# Patient Record
Sex: Female | Born: 1935 | Race: White | Hispanic: No | Smoking: Never smoker
Health system: Southern US, Community
[De-identification: ages and names within clinical notes are randomized; demographics above are authoritative.]

## PROBLEM LIST (undated history)

## (undated) DIAGNOSIS — M069 Rheumatoid arthritis, unspecified: Secondary | ICD-10-CM

## (undated) DIAGNOSIS — F0390 Unspecified dementia without behavioral disturbance: Secondary | ICD-10-CM

## (undated) DIAGNOSIS — I2699 Other pulmonary embolism without acute cor pulmonale: Secondary | ICD-10-CM

## (undated) DIAGNOSIS — I480 Paroxysmal atrial fibrillation: Secondary | ICD-10-CM

## (undated) DIAGNOSIS — R001 Bradycardia, unspecified: Secondary | ICD-10-CM

## (undated) DIAGNOSIS — R55 Syncope and collapse: Secondary | ICD-10-CM

## (undated) DIAGNOSIS — E785 Hyperlipidemia, unspecified: Secondary | ICD-10-CM

## (undated) DIAGNOSIS — I4891 Unspecified atrial fibrillation: Secondary | ICD-10-CM

## (undated) DIAGNOSIS — U071 COVID-19: Secondary | ICD-10-CM

## (undated) DIAGNOSIS — I10 Essential (primary) hypertension: Secondary | ICD-10-CM

## (undated) DIAGNOSIS — K2270 Barrett's esophagus without dysplasia: Secondary | ICD-10-CM

## (undated) DIAGNOSIS — J9690 Respiratory failure, unspecified, unspecified whether with hypoxia or hypercapnia: Secondary | ICD-10-CM

## (undated) HISTORY — DX: Essential (primary) hypertension: I10

## (undated) HISTORY — DX: Rheumatoid arthritis, unspecified: M06.9

## (undated) HISTORY — DX: Syncope and collapse: R55

## (undated) HISTORY — DX: Hyperlipidemia, unspecified: E78.5

## (undated) HISTORY — DX: Bradycardia, unspecified: R00.1

---
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
## 2009-04-29 ENCOUNTER — Emergency Department (HOSPITAL_BASED_OUTPATIENT_CLINIC_OR_DEPARTMENT_OTHER): Admission: EM | Admit: 2009-04-29 | Discharge: 2009-04-29 | Payer: Self-pay | Admitting: Emergency Medicine

## 2009-04-29 ENCOUNTER — Ambulatory Visit: Payer: Self-pay | Admitting: Diagnostic Radiology

## 2010-12-30 LAB — POCT CARDIAC MARKERS
CKMB, poc: 2.6 ng/mL (ref 1.0–8.0)
Myoglobin, poc: 62.6 ng/mL (ref 12–200)
Troponin i, poc: <0.05 ng/mL (ref 0.00–0.09)

## 2010-12-30 LAB — DIFFERENTIAL
Basophils Absolute: 0.1 10*3/uL (ref 0.0–0.1)
Basophils Relative: 2 % — ABNORMAL HIGH (ref 0–1)
Eosinophils Absolute: 0 10*3/uL (ref 0.0–0.7)
Eosinophils Relative: 1 % (ref 0–5)
Lymphocytes Relative: 18 % (ref 12–46)
Lymphs Abs: 1.4 10*3/uL (ref 0.7–4.0)
Monocytes Absolute: 0.5 10*3/uL (ref 0.1–1.0)
Monocytes Relative: 6 % (ref 3–12)
Neutro Abs: 5.7 10*3/uL (ref 1.7–7.7)
Neutrophils Relative %: 74 % (ref 43–77)

## 2010-12-30 LAB — URINE MICROSCOPIC-ADD ON

## 2010-12-30 LAB — CBC
HCT: 46.6 % — ABNORMAL HIGH (ref 36.0–46.0)
Hemoglobin: 15.5 g/dL — ABNORMAL HIGH (ref 12.0–15.0)
MCHC: 33.3 g/dL (ref 30.0–36.0)
MCV: 94 fL (ref 78.0–100.0)
Platelets: 285 10*3/uL (ref 150–400)
RBC: 4.96 MIL/uL (ref 3.87–5.11)
RDW: 12.2 % (ref 11.5–15.5)
WBC: 7.7 10*3/uL (ref 4.0–10.5)

## 2010-12-30 LAB — BASIC METABOLIC PANEL
BUN: 15 mg/dL (ref 6–23)
CO2: 26 mEq/L (ref 19–32)
Calcium: 10 mg/dL (ref 8.4–10.5)
Chloride: 93 mEq/L — ABNORMAL LOW (ref 96–112)
Creatinine, Ser: 0.8 mg/dL (ref 0.4–1.2)
GFR calc Af Amer: >60 mL/min (ref >60)
GFR calc non Af Amer: >60 mL/min (ref >60)
Glucose, Bld: 124 mg/dL — ABNORMAL HIGH (ref 70–99)
Potassium: 4.2 mEq/L (ref 3.5–5.1)
Sodium: 132 mEq/L — ABNORMAL LOW (ref 135–145)

## 2010-12-30 LAB — POCT B-TYPE NATRIURETIC PEPTIDE (BNP): B Natriuretic Peptide, POC: 104 pg/mL — ABNORMAL HIGH (ref 0–100)

## 2010-12-30 LAB — URINALYSIS, ROUTINE W REFLEX MICROSCOPIC
Bilirubin Urine: NEGATIVE
Glucose, UA: NEGATIVE mg/dL
Hgb urine dipstick: NEGATIVE
Ketones, ur: 15 mg/dL — AB
Nitrite: NEGATIVE
Protein, ur: NEGATIVE mg/dL
Specific Gravity, Urine: 1.008 (ref 1.005–1.030)
Urobilinogen, UA: 0.2 mg/dL (ref 0.0–1.0)
pH: 8 (ref 5.0–8.0)

## 2019-09-29 ENCOUNTER — Emergency Department (HOSPITAL_COMMUNITY)
Admission: EM | Admit: 2019-09-29 | Discharge: 2019-09-29 | Disposition: A | Discharge status: Home / Self Care | Payer: Medicare Other | Attending: Emergency Medicine | Admitting: Emergency Medicine

## 2019-09-29 ENCOUNTER — Emergency Department (HOSPITAL_COMMUNITY): Payer: Medicare Other

## 2019-09-29 ENCOUNTER — Encounter (HOSPITAL_COMMUNITY): Payer: Self-pay | Admitting: Emergency Medicine

## 2019-09-29 ENCOUNTER — Other Ambulatory Visit: Payer: Self-pay

## 2019-09-29 DIAGNOSIS — F0390 Unspecified dementia without behavioral disturbance: Secondary | ICD-10-CM | POA: Diagnosis not present

## 2019-09-29 DIAGNOSIS — W19XXXA Unspecified fall, initial encounter: Secondary | ICD-10-CM

## 2019-09-29 DIAGNOSIS — R55 Syncope and collapse: Secondary | ICD-10-CM | POA: Insufficient documentation

## 2019-09-29 DIAGNOSIS — Z043 Encounter for examination and observation following other accident: Secondary | ICD-10-CM | POA: Insufficient documentation

## 2019-09-29 LAB — COMPREHENSIVE METABOLIC PANEL
ALT: 14 U/L (ref 0–44)
AST: 16 U/L (ref 15–41)
Albumin: 3.2 g/dL — ABNORMAL LOW (ref 3.5–5.0)
Alkaline Phosphatase: 61 U/L (ref 38–126)
Anion gap: 9 (ref 5–15)
BUN: 27 mg/dL — ABNORMAL HIGH (ref 8–23)
CO2: 25 mmol/L (ref 22–32)
Calcium: 9 mg/dL (ref 8.9–10.3)
Chloride: 109 mmol/L (ref 98–111)
Creatinine, Ser: 0.98 mg/dL (ref 0.44–1.00)
GFR calc Af Amer: 59 mL/min — ABNORMAL LOW (ref >60)
GFR calc non Af Amer: 51 mL/min — ABNORMAL LOW (ref >60)
Glucose, Bld: 129 mg/dL — ABNORMAL HIGH (ref 70–99)
Potassium: 4.6 mmol/L (ref 3.5–5.1)
Sodium: 143 mmol/L (ref 135–145)
Total Bilirubin: 0.5 mg/dL (ref 0.3–1.2)
Total Protein: 6.3 g/dL — ABNORMAL LOW (ref 6.5–8.1)

## 2019-09-29 LAB — CBC WITH DIFFERENTIAL/PLATELET
Abs Immature Granulocytes: 0.05 10*3/uL (ref 0.00–0.07)
Basophils Absolute: 0.1 10*3/uL (ref 0.0–0.1)
Basophils Relative: 1 %
Eosinophils Absolute: 0.3 10*3/uL (ref 0.0–0.5)
Eosinophils Relative: 4 %
HCT: 35.4 % — ABNORMAL LOW (ref 36.0–46.0)
Hemoglobin: 10.9 g/dL — ABNORMAL LOW (ref 12.0–15.0)
Immature Granulocytes: 1 %
Lymphocytes Relative: 15 %
Lymphs Abs: 1 10*3/uL (ref 0.7–4.0)
MCH: 30.9 pg (ref 26.0–34.0)
MCHC: 30.8 g/dL (ref 30.0–36.0)
MCV: 100.3 fL — ABNORMAL HIGH (ref 80.0–100.0)
Monocytes Absolute: 0.7 10*3/uL (ref 0.1–1.0)
Monocytes Relative: 10 %
Neutro Abs: 4.7 10*3/uL (ref 1.7–7.7)
Neutrophils Relative %: 69 %
Platelets: 219 10*3/uL (ref 150–400)
RBC: 3.53 MIL/uL — ABNORMAL LOW (ref 3.87–5.11)
RDW: 16.6 % — ABNORMAL HIGH (ref 11.5–15.5)
WBC: 6.7 10*3/uL (ref 4.0–10.5)
nRBC: 0 % (ref 0.0–0.2)

## 2019-09-29 LAB — TROPONIN I (HIGH SENSITIVITY): Troponin I (High Sensitivity): 9 ng/L (ref <18)

## 2019-09-29 NOTE — ED Notes (Signed)
Patient transported to CT 

## 2019-09-29 NOTE — ED Provider Notes (Signed)
[facility]-EMERGENCY DEPT Provider Note   CSN: [id number] Arrival date & time: 09/29/19  1044     History Chief Complaint  Patient presents with  . Fall  . behavior issues    Sylvia Davila is a 86 y.o. female.  86 year old female from nursing facility here after patient had a witnessed fall.  Initially possible head injury.  Apparently according to the patient's nurse spoke with the facility has been some issues with behavioral problems.  Patient reportedly is unhappy with the facility.  Patient herself denies any chest pain or shortness of breath prior to the event.  Has had no recent illnesses.  No recent medication changes.  Denies any head or neck discomfort.  Patient placed in c-collar and transported here.  Does have a history of dementia.        History reviewed. No pertinent past medical history.  There are no problems to display for this patient.   History reviewed. No pertinent surgical history.   OB History   No obstetric history on file.     No family history on file.  Social History   Tobacco Use  . Smoking status: Not on file  Substance Use Topics  . Alcohol use: Not on file  . Drug use: Not on file    Home Medications Prior to Admission medications   Not on File    Allergies    Patient has no allergy information on record.  Review of Systems   Review of Systems  All other systems reviewed and are negative.   Physical Exam Updated Vital Signs BP 120/63 (BP Location: Left Arm)   Pulse (!) 52   Temp 97.6 F (36.4 C) (Oral)   Resp 15   SpO2 96%   Physical Exam Vitals and nursing note reviewed.  Constitutional:      General: She is not in acute distress.    Appearance: Normal appearance. She is well-developed. She is not toxic-appearing.  HENT:     Head: Normocephalic and atraumatic.  Eyes:     General: Lids are normal.     Conjunctiva/sclera: Conjunctivae normal.     Pupils: Pupils are equal, round, and  reactive to light.  Neck:     Thyroid: No thyroid mass.     Trachea: No tracheal deviation.  Cardiovascular:     Rate and Rhythm: Normal rate and regular rhythm.     Heart sounds: Normal heart sounds. No murmur. No gallop.   Pulmonary:     Effort: Pulmonary effort is normal. No respiratory distress.     Breath sounds: Normal breath sounds. No stridor. No decreased breath sounds, wheezing, rhonchi or rales.  Abdominal:     General: Bowel sounds are normal. There is no distension.     Palpations: Abdomen is soft.     Tenderness: There is no abdominal tenderness. There is no rebound.  Musculoskeletal:        General: No tenderness. Normal range of motion.     Cervical back: Normal range of motion and neck supple.  Skin:    General: Skin is warm and dry.     Findings: No abrasion or rash.  Neurological:     Mental Status: She is alert and oriented to person, place, and time. Mental status is at baseline.     GCS: GCS eye subscore is 4. GCS verbal subscore is 5. GCS motor subscore is 6.     Cranial Nerves: No cranial nerve deficit.  Sensory: No sensory deficit.     Motor: No weakness or tremor.  Psychiatric:        Attention and Perception: Attention normal.        Speech: Speech normal.        Behavior: Behavior normal.     ED Results / Procedures / Treatments   Labs (all labs ordered are listed, but only abnormal results are displayed) Labs Reviewed  CBC WITH DIFFERENTIAL/PLATELET  COMPREHENSIVE METABOLIC PANEL  TROPONIN I (HIGH SENSITIVITY)    EKG EKG Interpretation  Date/Time:  Wednesday September 29 2019 11:24:01 EST Ventricular Rate:  49 PR Interval:    QRS Duration: 94 QT Interval:  462 QTC Calculation: 418 R Axis:   26 Text Interpretation: Sinus bradycardia Confirmed by Lacretia Leigh ([id number]) on 09/29/2019 12:26:42 PM   Radiology No results found.  Procedures Procedures (including critical care time)  Medications Ordered in ED Medications - No data to  display  ED Course  I have reviewed the triage vital signs and the nursing notes.  Pertinent labs & imaging results that were available during my care of the patient were reviewed by me and considered in my medical decision making (see chart for details).    MDM Rules/Calculators/A&P                      Patient orthostatic negative here.  Head and neck CT without acute findings.  EKG is reassuring.  Feels fine at this time is requesting to go home. Final Clinical Impression(s) / ED Diagnoses Final diagnoses:  None    Rx / DC Orders ED Discharge Orders    None       Lacretia Leigh, MD 09/29/19 1347

## 2019-09-29 NOTE — ED Notes (Signed)
Report called to Toniann Fail at [facility].  PTAR transported pt back to facility.

## 2019-09-29 NOTE — ED Notes (Signed)
Pt is becoming agitated and requesting to leave. Pt removed tele monitoring, pulse ox, and bp cuff. NT explained delay and gave pt ham sandwich and water per request. No other concerns expressed at this time.

## 2019-09-29 NOTE — ED Notes (Signed)
Spoke with staff member, Toniann Fail, from [facility]- Per staff- Pt is "playing possum." A med tech was in the pts room this morning when med tech witnessed pt slide from her wheelchair to all 4's on the floor, then lay on her back on the floor.  Pt refused to be assisted off the floor.  While sitting on the floor resting again staff members leg, pt "pretended to faint again, but she was still holding her head up, she just was refusing to answer our questions, wouldn't open her eyes."  [facility] staff reports pt was seen by doctor yesterday, no new orders for pt.  Pt has not started any new medications. Pt has only been residing in facility for appx 2 weeks, no behavioral issues prior to todays event. EDP Freida Busman made aware.

## 2019-09-29 NOTE — ED Triage Notes (Signed)
Arrives via EMS from [facility], staff reports a loss of consciousness this AM, when staff tried to pick her up she reported to have another one, patient reports that she was acting during the second episode and that she does not like the facility. Hx of dementia. In C-collar. Vitals BP 136/74, P 56, O2 98% RA, CBG 168, T 97.9

## 2019-11-22 ENCOUNTER — Other Ambulatory Visit: Payer: Self-pay

## 2019-11-22 ENCOUNTER — Emergency Department (HOSPITAL_COMMUNITY)
Admission: EM | Admit: 2019-11-22 | Discharge: 2019-11-22 | Disposition: A | Discharge status: Home / Self Care | Payer: Medicare Other | Attending: Emergency Medicine | Admitting: Emergency Medicine

## 2019-11-22 ENCOUNTER — Emergency Department (HOSPITAL_COMMUNITY): Payer: Medicare Other

## 2019-11-22 DIAGNOSIS — N3000 Acute cystitis without hematuria: Secondary | ICD-10-CM

## 2019-11-22 DIAGNOSIS — R4182 Altered mental status, unspecified: Secondary | ICD-10-CM | POA: Diagnosis present

## 2019-11-22 LAB — URINALYSIS, ROUTINE W REFLEX MICROSCOPIC
Bilirubin Urine: NEGATIVE
Glucose, UA: NEGATIVE mg/dL
Hgb urine dipstick: NEGATIVE
Ketones, ur: NEGATIVE mg/dL
Nitrite: NEGATIVE
Protein, ur: 100 mg/dL — AB
Specific Gravity, Urine: 1.012 (ref 1.005–1.030)
WBC, UA: >50 WBC/hpf — ABNORMAL HIGH (ref 0–5)
pH: 9 — ABNORMAL HIGH (ref 5.0–8.0)

## 2019-11-22 LAB — CBC WITH DIFFERENTIAL/PLATELET
Abs Immature Granulocytes: 0.03 10*3/uL (ref 0.00–0.07)
Basophils Absolute: 0.1 10*3/uL (ref 0.0–0.1)
Basophils Relative: 1 %
Eosinophils Absolute: 0.1 10*3/uL (ref 0.0–0.5)
Eosinophils Relative: 1 %
HCT: 41.1 % (ref 36.0–46.0)
Hemoglobin: 12.9 g/dL (ref 12.0–15.0)
Immature Granulocytes: 1 %
Lymphocytes Relative: 20 %
Lymphs Abs: 1.3 10*3/uL (ref 0.7–4.0)
MCH: 30.4 pg (ref 26.0–34.0)
MCHC: 31.4 g/dL (ref 30.0–36.0)
MCV: 96.9 fL (ref 80.0–100.0)
Monocytes Absolute: 0.6 10*3/uL (ref 0.1–1.0)
Monocytes Relative: 9 %
Neutro Abs: 4.5 10*3/uL (ref 1.7–7.7)
Neutrophils Relative %: 68 %
Platelets: 217 10*3/uL (ref 150–400)
RBC: 4.24 MIL/uL (ref 3.87–5.11)
RDW: 14.8 % (ref 11.5–15.5)
WBC: 6.5 10*3/uL (ref 4.0–10.5)
nRBC: 0 % (ref 0.0–0.2)

## 2019-11-22 LAB — COMPREHENSIVE METABOLIC PANEL
ALT: 16 U/L (ref 0–44)
AST: 22 U/L (ref 15–41)
Albumin: 3.6 g/dL (ref 3.5–5.0)
Alkaline Phosphatase: 55 U/L (ref 38–126)
Anion gap: 11 (ref 5–15)
BUN: 26 mg/dL — ABNORMAL HIGH (ref 8–23)
CO2: 21 mmol/L — ABNORMAL LOW (ref 22–32)
Calcium: 9 mg/dL (ref 8.9–10.3)
Chloride: 108 mmol/L (ref 98–111)
Creatinine, Ser: 1.09 mg/dL — ABNORMAL HIGH (ref 0.44–1.00)
GFR calc Af Amer: 52 mL/min — ABNORMAL LOW (ref >60)
GFR calc non Af Amer: 45 mL/min — ABNORMAL LOW (ref >60)
Glucose, Bld: 138 mg/dL — ABNORMAL HIGH (ref 70–99)
Potassium: 4 mmol/L (ref 3.5–5.1)
Sodium: 140 mmol/L (ref 135–145)
Total Bilirubin: 0.8 mg/dL (ref 0.3–1.2)
Total Protein: 6.8 g/dL (ref 6.5–8.1)

## 2019-11-22 MED ORDER — NITROFURANTOIN MONOHYD MACRO 100 MG PO CAPS: 100.0000 mg | ORAL_CAPSULE | Freq: Two times a day (BID) | ORAL | 0 refills | Status: DC

## 2019-11-22 MED ORDER — SODIUM CHLORIDE 0.9 % IV SOLN: 1.0000 g | Freq: Once | INTRAVENOUS | Status: DC

## 2019-11-22 NOTE — ED Notes (Signed)
Attempted to contact [facility] to give report on patient. Unable to contact facility at this time. Will try again later.

## 2019-11-22 NOTE — ED Triage Notes (Addendum)
Patient arriving by EMS from [facility], staff states patient is "more combative than usual." Pt attempting to run out of front door, hitting windows, etc Staff also states patient had "fainting spell" and has these often. No LOC or fall. Staff gave Ativan 2.5 mg at approx 0820. Patient has not had morning medications. Pt has hx of dementia.  EMS reports patient has been calm and cooperative.   BP 138/68 HR 68 99%RA T 99.2 CBG 138

## 2019-11-22 NOTE — ED Notes (Signed)
purewick placed on patient.  

## 2019-11-22 NOTE — Discharge Instructions (Addendum)
Return here as needed.  She does have a urinary tract infection and will need to be reassessed by her doctor.  This could cause her to be more agitated than normal.

## 2019-11-22 NOTE — ED Provider Notes (Signed)
[facility]-EMERGENCY DEPT Provider Note   CSN: [id number] Arrival date & time: 11/22/19  8527     History Chief Complaint  Patient presents with  . Altered Mental Status    Sylvia Davila is a 86 y.o. female.  HPI Patient presents to the emergency department with being more combative than usual.  The patient seems more agitated per the staff.  Patient is unable give any history due to her severe dementia.  The patient has had no other recent illnesses or other symptoms other than this agitation.  Staff did give the patient 2.5 Ativan prior to coming to the hospital.    No past medical history on file.  There are no problems to display for this patient.   No past surgical history on file.   OB History   No obstetric history on file.     No family history on file.  Social History   Tobacco Use  . Smoking status: Not on file  Substance Use Topics  . Alcohol use: Not on file  . Drug use: Not on file    Home Medications Prior to Admission medications   Medication Sig Start Date End Date Taking? Authorizing Provider  acetaminophen (TYLENOL) 650 MG CR tablet Take 1,300 mg by mouth every 8 (eight) hours as needed for pain.   Yes [provider]  alendronate (FOSAMAX) 70 MG tablet Take 70 mg by mouth once a week. Monday 10/16/16  Yes [provider]  amLODipine (NORVASC) 10 MG tablet Take 10 mg by mouth daily. 12/02/16  Yes [provider]  ascorbic acid (VITAMIN C) 1000 MG tablet Take 1,000 mg by mouth daily at 12 noon.   Yes [provider]  atenolol (TENORMIN) 50 MG tablet Take 50 mg by mouth daily.  03/12/17  Yes [provider]  atorvastatin (LIPITOR) 20 MG tablet Take 20 mg by mouth daily.   Yes [provider]  Calcium Carbonate 500 MG CHEW Chew 500 mg by mouth daily. Tablet   Yes [provider]  diclofenac Sodium (VOLTAREN) 1 % GEL Apply 2 g topically 4 (four) times daily.   Yes  [provider]  diltiazem (TIAZAC) 120 MG 24 hr capsule Take 120 mg by mouth daily. 08/18/19  Yes [provider]  docusate sodium (COLACE) 50 MG capsule Take 50 mg by mouth daily as needed for mild constipation.   Yes [provider]  famotidine-calcium carbonate-magnesium hydroxide (PEPCID COMPLETE) 10-800-165 MG chewable tablet Chew 1 tablet by mouth daily as needed for heartburn.   Yes [provider]  folic acid (FOLVITE) 800 MCG tablet Take 800 mg by mouth daily.   Yes [provider]  furosemide (LASIX) 20 MG tablet Take 20 mg by mouth daily.   Yes [provider]  GLUCOSAMINE-CHONDROIT-VIT C-MN PO Take 1 tablet by mouth daily.   Yes [provider]  Lecithin 1200 MG CAPS Take 1,200 mg by mouth daily.   Yes [provider]  LORazepam (ATIVAN) 0.5 MG tablet Take 0.5 mg by mouth daily.   Yes [provider]  methotrexate (RHEUMATREX) 2.5 MG tablet Take 10 mg by mouth once a week. Caution:Chemotherapy. Protect from light. Take on Friday   Yes [provider]  Multiple Vitamin (MULTI-VITAMIN) tablet Take 1 tablet by mouth daily.   Yes [provider]  Omega-3 1000 MG CAPS Take 2 g by mouth daily.   Yes [provider]  ondansetron (ZOFRAN) 4 MG tablet Take  4 mg by mouth every 8 (eight) hours as needed.   Yes [provider]  pantoprazole (PROTONIX) 20 MG tablet Take 20 mg by mouth daily.   Yes [provider]  polyethylene glycol powder (GLYCOLAX/MIRALAX) 17 GM/SCOOP powder Take 17 g by mouth daily as needed.   Yes [provider]  potassium chloride (KLOR-CON) 10 MEQ tablet Take 10 mEq by mouth daily.   Yes [provider]  prochlorperazine (COMPAZINE) 10 MG tablet Take 10 mg by mouth every 8 (eight) hours as needed. 07/27/18  Yes [provider]  Psyllium 100 % PACK Take 5 mLs by mouth every Thursday. 4-8 oz liquid   Yes [provider]  rivaroxaban (XARELTO) 20 MG TABS tablet Take 20 mg by mouth daily with supper.   Yes [provider]  sodium chloride 1 g tablet Take 1 g by mouth daily.   Yes [provider]    Allergies    Meclizine and Penicillins  Review of Systems   Review of Systems All other systems negative except as documented in the HPI. All pertinent positives and negatives as reviewed in the HPI. Physical Exam Updated Vital Signs BP (!) 172/79   Pulse 82   Temp 97.7 F (36.5 C) (Oral)   Resp 12   SpO2 90%   Physical Exam Vitals and nursing note reviewed.  Constitutional:      General: She is not in acute distress.    Appearance: She is well-developed.  HENT:     Head: Normocephalic and atraumatic.  Eyes:     Pupils: Pupils are equal, round, and reactive to light.  Cardiovascular:     Rate and Rhythm: Normal rate and regular rhythm.     Heart sounds: Normal heart sounds. No murmur. No friction rub. No gallop.   Pulmonary:     Effort: Pulmonary effort is normal. No respiratory distress.     Breath sounds: Normal breath sounds. No wheezing.  Abdominal:     General: Bowel sounds are normal. There is no distension.     Palpations: Abdomen is soft.     Tenderness: There is no abdominal tenderness.  Musculoskeletal:     Cervical back: Normal range of motion and neck supple.  Skin:    General: Skin is warm and dry.     Capillary Refill: Capillary refill takes less than 2 seconds.     Findings: No erythema or rash.  Neurological:     Mental Status: She is alert. Mental status is at baseline.     Motor: No abnormal muscle tone.     Coordination: Coordination normal.  Psychiatric:        Behavior: Behavior normal.     ED Results / Procedures / Treatments   Labs (all labs ordered are listed, but only abnormal results are displayed) Labs Reviewed  COMPREHENSIVE METABOLIC PANEL - Abnormal; Notable for the following components:      Result Value   CO2 21 (*)    Glucose,  Bld 138 (*)    BUN 26 (*)    Creatinine, Ser 1.09 (*)    GFR calc non Af Amer 45 (*)    GFR calc Af Amer 52 (*)    All other components within normal limits  URINALYSIS, ROUTINE W REFLEX MICROSCOPIC - Abnormal; Notable for the following components:   APPearance CLOUDY (*)    pH 9.0 (*)    Protein, ur 100 (*)    Leukocytes,Ua LARGE (*)  WBC, UA >50 (*)    Bacteria, UA RARE (*)    All other components within normal limits  URINE CULTURE  CBC WITH DIFFERENTIAL/PLATELET    EKG EKG Interpretation  Date/Time:  Monday November 22 2019 10:17:29 EST Ventricular Rate:  69 PR Interval:    QRS Duration: 97 QT Interval:  422 QTC Calculation: 453 R Axis:   -4 Text Interpretation: Sinus rhythm Probable anteroseptal infarct, old Confirmed by Meridee Score [phone]) on 11/22/2019 10:25:28 AM   Radiology CT Head Wo Contrast  Result Date: 11/22/2019 CLINICAL DATA:  Altered mental status EXAM: CT HEAD WITHOUT CONTRAST TECHNIQUE: Contiguous axial images were obtained from the base of the skull through the vertex without intravenous contrast. COMPARISON:  09/29/2019 FINDINGS: Brain: There is atrophy and chronic small vessel disease changes. No acute intracranial abnormality. Specifically, no hemorrhage, hydrocephalus, mass lesion, acute infarction, or significant intracranial injury. Vascular: No hyperdense vessel or unexpected calcification. Skull: No acute calvarial abnormality. Sinuses/Orbits: Visualized paranasal sinuses and mastoids clear. Orbital soft tissues unremarkable. Other: None IMPRESSION: Atrophy, chronic microvascular disease. No acute intracranial abnormality. Electronically Signed   By: Charlett Nose M.D.   On: 11/22/2019 11:07    Procedures Procedures (including critical care time)  Medications Ordered in ED Medications - No data to display  ED Course  I have reviewed the triage vital signs and the nursing notes.  Pertinent labs & imaging results that were available during my  care of the patient were reviewed by me and considered in my medical decision making (see chart for details).  Clinical Course as of Nov 22 1243  Mon Nov 22, 2019  7911 86 year old female sent in from her facility a [facility] for being more combative and agitated than baseline.  Patient denies any complaints and states she wants to leave.  She is agreeable to some lab testing but she said if it takes too long she is going to start screaming at [place].  Nonfocal exam.   [MB]    Clinical Course User Index [MB] Terrilee Files, MD   MDM Rules/Calculators/A&P                      Patient be treated for urinary tract infection as is the only abnormality that we have found so far in her testing that may explain her condition.  The patient has been stable thus far in the emergency department.  Patient will need to follow-up with her primary doctor. Final Clinical Impression(s) / ED Diagnoses Final diagnoses:  None    Rx / DC Orders ED Discharge Orders    None       Charlestine Night, PA-C 11/22/19 1248    Terrilee Files, MD 11/22/19 [phone]

## 2019-11-22 NOTE — ED Notes (Signed)
[facility] called to transport patient to [facility].

## 2019-11-22 NOTE — ED Notes (Signed)
Patient transported to CT 

## 2019-11-22 NOTE — ED Notes (Signed)
PTAR at bedside to transport patient to [facility].

## 2019-11-25 LAB — URINE CULTURE: Culture: >=100000 — AB

## 2019-11-26 ENCOUNTER — Telehealth: Payer: Self-pay | Admitting: *Deleted

## 2019-11-26 NOTE — Telephone Encounter (Signed)
Post ED Visit - Positive Culture Follow-up: Successful Patient Follow-Up  Culture assessed and recommendations reviewed by:  []  , Pharm.D. []  Enzo Bi, Pharm.D., BCPS AQ-ID []  , Pharm.D., BCPS []  Celedonio Miyamoto, Pharm.D., BCPS []  [place], Garvin Fila.D., BCPS, AAHIVP []  , Pharm.D., BCPS, AAHIVP []  Georgina Pillion, PharmD, BCPS []  , PharmD, BCPS []  [place], PharmD, BCPS []  [address], PharmD  Positive urine culture  []  Patient discharged without antimicrobial prescription and treatment is now indicated [x]  Organism is resistant to prescribed ED discharge antimicrobial []  Patient with positive blood cultures    Plan: Stop Nitrofurantoin and give Fosfomycin 3grams x 1 dose,   Changes discussed with ED provider , PA New antibiotic prescription Fosfomycin 3 grams PO x 1 dose Faxed to [facility] [phone]  Contacted patient, date 11/26/2019, time 1215   11/26/2019, 12:27 PM

## 2019-12-22 ENCOUNTER — Other Ambulatory Visit: Payer: Self-pay

## 2019-12-22 ENCOUNTER — Encounter (HOSPITAL_COMMUNITY): Payer: Self-pay | Admitting: *Deleted

## 2019-12-22 ENCOUNTER — Emergency Department (HOSPITAL_COMMUNITY): Payer: Medicare Other

## 2019-12-22 ENCOUNTER — Emergency Department (HOSPITAL_COMMUNITY)
Admission: EM | Admit: 2019-12-22 | Discharge: 2019-12-22 | Disposition: A | Discharge status: Home / Self Care | Payer: Medicare Other | Attending: Emergency Medicine | Admitting: Emergency Medicine

## 2019-12-22 DIAGNOSIS — M542 Cervicalgia: Secondary | ICD-10-CM | POA: Diagnosis present

## 2019-12-22 DIAGNOSIS — W19XXXA Unspecified fall, initial encounter: Secondary | ICD-10-CM | POA: Insufficient documentation

## 2019-12-22 DIAGNOSIS — M25531 Pain in right wrist: Secondary | ICD-10-CM | POA: Diagnosis not present

## 2019-12-22 DIAGNOSIS — M25532 Pain in left wrist: Secondary | ICD-10-CM | POA: Insufficient documentation

## 2019-12-22 DIAGNOSIS — F0390 Unspecified dementia without behavioral disturbance: Secondary | ICD-10-CM | POA: Diagnosis not present

## 2019-12-22 DIAGNOSIS — I4891 Unspecified atrial fibrillation: Secondary | ICD-10-CM | POA: Diagnosis not present

## 2019-12-22 DIAGNOSIS — Z7901 Long term (current) use of anticoagulants: Secondary | ICD-10-CM | POA: Insufficient documentation

## 2019-12-22 DIAGNOSIS — Z86711 Personal history of pulmonary embolism: Secondary | ICD-10-CM | POA: Diagnosis not present

## 2019-12-22 HISTORY — DX: Other pulmonary embolism without acute cor pulmonale: I26.99

## 2019-12-22 HISTORY — DX: Unspecified atrial fibrillation: I48.91

## 2019-12-22 HISTORY — DX: Barrett's esophagus without dysplasia: K22.70

## 2019-12-22 HISTORY — DX: COVID-19: U07.1

## 2019-12-22 HISTORY — DX: Respiratory failure, unspecified, unspecified whether with hypoxia or hypercapnia: J96.90

## 2019-12-22 NOTE — Discharge Instructions (Signed)
No fracture or severe injury was seen on the x-rays and scans today.  Follow-up with your doctor as needed.  There was some abnormalities on the CT scan of the upper chest.  They were stable from before but will need to be followed by her PCP if there is concern of malignancy.

## 2019-12-22 NOTE — ED Notes (Signed)
PTAR called for transport.  

## 2019-12-22 NOTE — ED Triage Notes (Signed)
BIB EMS from [facility] home. Unwitnessed fall, No LOC, No head injury. A& O x 1 which is baseline. Larey Seat forward, ? Injuries to both wrist. No other injuries noted. 150/80-70-20-97% RA CBG 159

## 2019-12-22 NOTE — ED Provider Notes (Signed)
[facility]-EMERGENCY DEPT Provider Note   CSN: [id number] Arrival date & time: 12/22/19  1554     History Chief Complaint  Patient presents with   Sylvia Davila is a 86 y.o. female.  HPI Level 5 caveat due to dementia.  Question of fall from [place] nursing home.  Complaining of pain in her neck.  Reportedly at baseline.  Appears that she may be on anticoagulation.  Denies numbness or weakness.    Past Medical History:  Diagnosis Date   Atrial fibrillation (HCC)    Barrett's esophagus    COVID-19    Pulmonary emboli (HCC)    Respiratory failure (HCC)     There are no problems to display for this patient.   History reviewed. No pertinent surgical history.   OB History   No obstetric history on file.     No family history on file.  Social History   Tobacco Use   Smoking status: Never Smoker   Smokeless tobacco: Never Used  Substance Use Topics   Alcohol use: Never   Drug use: Never    Home Medications Prior to Admission medications   Medication Sig Start Date End Date Taking? Authorizing Provider  acetaminophen (TYLENOL) 650 MG CR tablet Take 1,300 mg by mouth every 8 (eight) hours as needed for pain.    [provider]  alendronate (FOSAMAX) 70 MG tablet Take 70 mg by mouth once a week. Monday 10/16/16   [provider]  amLODipine (NORVASC) 10 MG tablet Take 10 mg by mouth daily. 12/02/16   [provider]  ascorbic acid (VITAMIN C) 1000 MG tablet Take 1,000 mg by mouth daily at 12 noon.    [provider]  atenolol (TENORMIN) 50 MG tablet Take 50 mg by mouth daily.  03/12/17   [provider]  atorvastatin (LIPITOR) 20 MG tablet Take 20 mg by mouth daily.    [provider]  Calcium Carbonate 500 MG CHEW Chew 500 mg by mouth daily. Tablet    [provider]  diclofenac Sodium (VOLTAREN) 1 % GEL Apply 2 g topically 4 (four) times daily.    [provider]  diltiazem (TIAZAC) 120 MG 24 hr capsule Take 120 mg by mouth daily. 08/18/19   [provider]  docusate sodium (COLACE) 50 MG capsule Take 50 mg by mouth daily as needed for mild constipation.    [provider]  famotidine-calcium carbonate-magnesium hydroxide (PEPCID COMPLETE) 10-800-165 MG chewable tablet Chew 1 tablet by mouth daily as needed for heartburn.    [provider]  folic acid (FOLVITE) 800 MCG tablet Take 800 mg by mouth daily.    [provider]  furosemide (LASIX) 20 MG tablet Take 20 mg by mouth daily.    [provider]  GLUCOSAMINE-CHONDROIT-VIT C-MN PO Take 1 tablet by mouth daily.    [provider]  Lecithin 1200 MG CAPS Take 1,200 mg by mouth daily.    [provider]  LORazepam (ATIVAN) 0.5 MG tablet Take 0.5 mg by mouth daily.    [provider]  methotrexate (RHEUMATREX) 2.5 MG tablet Take 10 mg by mouth once a week. Caution:Chemotherapy. Protect from light. Take on Friday    [provider]  Multiple Vitamin (MULTI-VITAMIN) tablet Take 1 tablet by mouth daily.    [provider]  nitrofurantoin, macrocrystal-monohydrate, (MACROBID) 100 MG capsule Take 1 capsule (100 mg total) by mouth 2 (two) times daily. 11/22/19  Lawyer, Christopher, PA-C  Omega-3 1000 MG CAPS Take 2 g by mouth daily.    [provider]  ondansetron (ZOFRAN) 4 MG tablet Take 4 mg by mouth every 8 (eight) hours as needed.    [provider]  pantoprazole (PROTONIX) 20 MG tablet Take 20 mg by mouth daily.    [provider]  polyethylene glycol powder (GLYCOLAX/MIRALAX) 17 GM/SCOOP powder Take 17 g by mouth daily as needed.    [provider]  potassium chloride (KLOR-CON) 10 MEQ tablet Take 10 mEq by mouth daily.    [provider]  prochlorperazine (COMPAZINE) 10 MG tablet Take 10 mg by mouth every 8 (eight) hours as needed. 07/27/18   [provider]  Psyllium 100 % PACK Take 5 mLs by mouth every Thursday. 4-8 oz liquid    [provider]  rivaroxaban (XARELTO) 20 MG TABS tablet Take 20 mg by mouth daily with supper.    [provider]  sodium chloride 1 g tablet Take 1 g by mouth daily.    [provider]    Allergies    Meclizine and Penicillins  Review of Systems   Review of Systems  Unable to perform ROS: Dementia    Physical Exam Updated Vital Signs BP 126/64    Pulse 66    Temp 97.9 F (36.6 C)    Resp 16    SpO2 99%   Physical Exam Vitals and nursing note reviewed.  HENT:     Head: Normocephalic.  Eyes:     Pupils: Pupils are equal, round, and reactive to light.  Cardiovascular:     Rate and Rhythm: Normal rate.  Pulmonary:     Breath sounds: No wheezing or rhonchi.  Chest:     Chest wall: No tenderness.  Abdominal:     Tenderness: There is no abdominal tenderness.  Musculoskeletal:     Cervical back: Neck supple.     Comments: Mild tenderness to bilateral wrists.  Some tenderness over right elbow.  Right forearm held against body.  No tenderness over proximal upper extremities.  Does have some tenderness over upper cervical spine.  Cervical collar in place.  No tenderness over hips.  No tenderness over thoracic or lumbar spine.  No tenderness over knees.  Does have tenderness over bilateral lower legs.  Some edema.  Appears chronic.  No ankle or foot tenderness.  Skin:    Capillary Refill: Capillary refill takes less than 2 seconds.  Neurological:     Mental Status: She is alert. Mental status is at baseline.     ED Results / Procedures / Treatments   Labs (all labs ordered are listed, but only abnormal results are displayed) Labs Reviewed - No data to display  EKG None  Radiology DG Elbow Complete Right  Result Date: 12/22/2019 CLINICAL DATA:  [place]are[place], injury EXAM: RIGHT ELBOW - COMPLETE 3+ VIEW COMPARISON:  None. FINDINGS: Frontal, bilateral oblique, lateral  views of the right elbow are obtained. Evaluation is limited by patient cooperation and positioning. No displaced fracture. Alignment is anatomic. No joint effusion. Soft tissues are normal. IMPRESSION: 1. No acute displaced fracture. Electronically Signed   By: Sharlet Salina M.D.   On: 12/22/2019 17:58   DG Wrist Complete Left  Result Date: 12/22/2019 CLINICAL DATA:  Larey Seat EXAM: LEFT WRIST - COMPLETE 3+ VIEW COMPARISON:  None. FINDINGS: Frontal, oblique, lateral views of the left wrist are obtained. There is severe osteoarthritis throughout the wrist, with marked joint  space narrowing and osteophyte formation throughout the carpus. Changes are most pronounced at the first carpometacarpal joint, with significant subluxation. I do not see any acute displaced fracture. There is mild dorsal soft tissue swelling. IMPRESSION: 1. Severe osteoarthritis. 2. No acute displaced fracture. 3. Dorsal soft tissue swelling. Electronically Signed   By: Randa Ngo M.D.   On: 12/22/2019 16:42   DG Wrist Complete Right  Result Date: 12/22/2019 CLINICAL DATA:  Wrist trauma secondary to a fall. EXAM: RIGHT WRIST - COMPLETE 3+ VIEW COMPARISON:  None. FINDINGS: No fracture or dislocation. Severe extensive arthritic changes of the wrist including the distal radioulnar joint, radiocarpal joint, first CMC joint, and between the scaphoid and trapezium and trapezoid. Irregular lucency in the distal scaphoid appears to be chronic. Probable chronic tear of the scapholunate ligament. Chondrocalcinosis. IMPRESSION: No acute abnormality. Severe arthritic changes. Probable chronic tear of the scapholunate ligament. Electronically Signed   By: Lorriane Shire M.D.   On: 12/22/2019 16:46   DG Tibia/Fibula Left  Result Date: 12/22/2019 CLINICAL DATA:  [place], injury EXAM: LEFT TIBIA AND FIBULA - 2 VIEW COMPARISON:  None. FINDINGS: Frontal and lateral views of the left tibia and fibula are obtained. There is severe left knee osteoarthritis.  No acute displaced fracture. The soft tissues are normal. IMPRESSION: 1. No acute bony abnormality. 2. Severe left knee osteoarthritis. Electronically Signed   By: Randa Ngo M.D.   On: 12/22/2019 17:59   DG Tibia/Fibula Right  Result Date: 12/22/2019 CLINICAL DATA:  [place], injury EXAM: RIGHT TIBIA AND FIBULA - 2 VIEW COMPARISON:  10/27/2018 FINDINGS: Frontal and lateral views of the right tibia and fibula are obtained. No acute displaced fracture. Severe right knee osteoarthritis unchanged. Soft tissues are normal. IMPRESSION: 1. No acute fracture. 2. Stable severe right knee osteoarthritis. Electronically Signed   By: Randa Ngo M.D.   On: 12/22/2019 17:59   CT Head Wo Contrast  Result Date: 12/22/2019 CLINICAL DATA:  Un witnessed fall EXAM: CT HEAD WITHOUT CONTRAST CT CERVICAL SPINE WITHOUT CONTRAST TECHNIQUE: Multidetector CT imaging of the head and cervical spine was performed following the standard protocol without intravenous contrast. Multiplanar CT image reconstructions of the cervical spine were also generated. COMPARISON:  11/22/2019 FINDINGS: CT HEAD FINDINGS Brain: No acute infarct or hemorrhage. Lateral ventricles and midline structures are unremarkable. No acute extra-axial fluid collections. No mass effect. Vascular: No hyperdense vessel or unexpected calcification. Skull: Normal. Negative for fracture or focal lesion. Sinuses/Orbits: No acute finding. Other: None CT CERVICAL SPINE FINDINGS Alignment: Straightening of the midcervical spine due to multilevel spondylosis and facet hypertrophy. Otherwise alignment is anatomic. Skull base and vertebrae: No acute displaced fracture. Soft tissues and spinal canal: No prevertebral fluid or swelling. No visible canal hematoma. Disc levels: Significant hypertrophic changes are seen at the C1/C2 interface with prominent pannus along the transverse ligament. This results in central canal stenosis. There is significant disc space narrowing and  osteophyte formation from C3 through C7. Osteophytes and uncovertebral hypertrophy at C4-5 and C5-6 result in symmetrical neural foraminal narrowing. Upper chest: The mixed solid and ground-glass lesion within the left upper lobe seen on prior chest CT 08/11/2019 is not appreciably changed. Solid component measures 2.1 x 0.9 cm, with stable ground-glass component measuring 4.3 x 3.3 cm. This remains nonspecific. Central airway is patent. Other: Reconstructed images demonstrate no additional findings. IMPRESSION: 1. No acute intracranial process. 2. Extensive multilevel cervical spondylosis, greatest at C4-5 and C5-6. 3. No acute cervical spine fracture. 4. Mixed  solid and ground-glass lesion within the left upper lobe, unchanged since prior chest CT. Differential includes persistent infection, inflammation, or neoplasm. If the patient would be a therapy candidate should neoplasm be detected, follow-up PET scan may be useful. Electronically Signed   By: Sharlet Salina M.D.   On: 12/22/2019 17:43   CT Cervical Spine Wo Contrast  Result Date: 12/22/2019 CLINICAL DATA:  Un witnessed fall EXAM: CT HEAD WITHOUT CONTRAST CT CERVICAL SPINE WITHOUT CONTRAST TECHNIQUE: Multidetector CT imaging of the head and cervical spine was performed following the standard protocol without intravenous contrast. Multiplanar CT image reconstructions of the cervical spine were also generated. COMPARISON:  11/22/2019 FINDINGS: CT HEAD FINDINGS Brain: No acute infarct or hemorrhage. Lateral ventricles and midline structures are unremarkable. No acute extra-axial fluid collections. No mass effect. Vascular: No hyperdense vessel or unexpected calcification. Skull: Normal. Negative for fracture or focal lesion. Sinuses/Orbits: No acute finding. Other: None CT CERVICAL SPINE FINDINGS Alignment: Straightening of the midcervical spine due to multilevel spondylosis and facet hypertrophy. Otherwise alignment is anatomic. Skull base and vertebrae:  No acute displaced fracture. Soft tissues and spinal canal: No prevertebral fluid or swelling. No visible canal hematoma. Disc levels: Significant hypertrophic changes are seen at the C1/C2 interface with prominent pannus along the transverse ligament. This results in central canal stenosis. There is significant disc space narrowing and osteophyte formation from C3 through C7. Osteophytes and uncovertebral hypertrophy at C4-5 and C5-6 result in symmetrical neural foraminal narrowing. Upper chest: The mixed solid and ground-glass lesion within the left upper lobe seen on prior chest CT 08/11/2019 is not appreciably changed. Solid component measures 2.1 x 0.9 cm, with stable ground-glass component measuring 4.3 x 3.3 cm. This remains nonspecific. Central airway is patent. Other: Reconstructed images demonstrate no additional findings. IMPRESSION: 1. No acute intracranial process. 2. Extensive multilevel cervical spondylosis, greatest at C4-5 and C5-6. 3. No acute cervical spine fracture. 4. Mixed solid and ground-glass lesion within the left upper lobe, unchanged since prior chest CT. Differential includes persistent infection, inflammation, or neoplasm. If the patient would be a therapy candidate should neoplasm be detected, follow-up PET scan may be useful. Electronically Signed   By: Sharlet Salina M.D.   On: 12/22/2019 17:43    Procedures Procedures (including critical care time)  Medications Ordered in ED Medications - No data to display  ED Course  I have reviewed the triage vital signs and the nursing notes.  Pertinent labs & imaging results that were available during my care of the patient were reviewed by me and considered in my medical decision making (see chart for details).    MDM Rules/Calculators/A&P                      Patient with apparent fall.  Imaging reassuring.  Some chronic lung changes that have been stable potentially could be malignancy.  To be followed as an outpatient.   Discharge back to nursing home. Final Clinical Impression(s) / ED Diagnoses Final diagnoses:  Fall, initial encounter    Rx / DC Orders ED Discharge Orders    None       Benjiman Core, MD 12/22/19 [phone]

## 2019-12-22 NOTE — ED Notes (Signed)
Left voicemail for [facility] for report.

## 2020-01-13 ENCOUNTER — Emergency Department (HOSPITAL_COMMUNITY): Payer: Medicare Other

## 2020-01-13 ENCOUNTER — Emergency Department (HOSPITAL_COMMUNITY)
Admission: EM | Admit: 2020-01-13 | Discharge: 2020-01-13 | Disposition: A | Discharge status: Home / Self Care | Payer: Medicare Other | Attending: Emergency Medicine | Admitting: Emergency Medicine

## 2020-01-13 ENCOUNTER — Encounter (HOSPITAL_COMMUNITY): Payer: Self-pay | Admitting: Student in an Organized Health Care Education/Training Program

## 2020-01-13 DIAGNOSIS — Y999 Unspecified external cause status: Secondary | ICD-10-CM | POA: Diagnosis not present

## 2020-01-13 DIAGNOSIS — Z7901 Long term (current) use of anticoagulants: Secondary | ICD-10-CM | POA: Diagnosis not present

## 2020-01-13 DIAGNOSIS — Y92122 Bedroom in nursing home as the place of occurrence of the external cause: Secondary | ICD-10-CM | POA: Diagnosis not present

## 2020-01-13 DIAGNOSIS — M542 Cervicalgia: Secondary | ICD-10-CM | POA: Diagnosis not present

## 2020-01-13 DIAGNOSIS — F0390 Unspecified dementia without behavioral disturbance: Secondary | ICD-10-CM | POA: Insufficient documentation

## 2020-01-13 DIAGNOSIS — Y939 Activity, unspecified: Secondary | ICD-10-CM | POA: Diagnosis not present

## 2020-01-13 DIAGNOSIS — M549 Dorsalgia, unspecified: Secondary | ICD-10-CM | POA: Insufficient documentation

## 2020-01-13 DIAGNOSIS — W19XXXA Unspecified fall, initial encounter: Secondary | ICD-10-CM | POA: Diagnosis not present

## 2020-01-13 HISTORY — DX: Paroxysmal atrial fibrillation: I48.0

## 2020-01-13 HISTORY — DX: Unspecified dementia, unspecified severity, without behavioral disturbance, psychotic disturbance, mood disturbance, and anxiety: F03.90

## 2020-01-13 NOTE — ED Provider Notes (Signed)
I saw and evaluated the patient, reviewed the resident's note and I agree with the findings and plan.  Pertinent History: This patient is a elderly female coming from a memory care unit, she had an unwitnessed fall, evidently she is on an anticoagulant.  She had no signs of trauma per the EMS personnel, she was moaning and crying out with any movement or even touching the patient.  There was no obvious witnessed fall, there is no obvious trauma.  Pertinent Exam findings: The patient has no leg length discrepancies, she is able to move all 4 extremities with help, she does not have any signs of fracture, her compartments are diffusely soft and joints are diffusely supple.  She keeps her eyes closed, she moans, when I take her cervical collar off she gives a sigh of relief but has no tenderness when I palpate over the spinal processes, there is no hematomas or depressed skull fractures, no signs of trauma about the face or the neck and affect there is no signs of trauma about the chest or abdomen.  I was personally present and directly supervised the following procedures:  Trauma evaluation after a fall which was unwitnessed   I personally interpreted the EKG as well as the resident and agree with the interpretation on the resident's chart.  Final diagnoses:  Fall, initial encounter      Eber Hong, MD 01/15/20 [phone]

## 2020-01-13 NOTE — Progress Notes (Signed)
Orthopedic Tech Progress Note Patient Details:  Sylvia Davila 09/23/1873 [id number] Level 2 trauma Patient ID: Sylvia Davila, female   DOB: 09/23/1873, 86 y.o.   MRN: [id number]   Sylvia Davila 01/13/2020, 5:30 PM

## 2020-01-13 NOTE — ED Notes (Signed)
Robin @ pt facility [phone]

## 2020-01-13 NOTE — ED Provider Notes (Signed)
[facility] EMERGENCY DEPARTMENT Provider Note   CSN: [id number] Arrival date & time: 01/13/20  1716     History Chief Complaint  Patient presents with  . Fall    Sylvia Davila is a 86 y.o. female.  The history is provided by the patient and the EMS personnel. History limited by: Dementia.     86 year old female with a past medical history of paroxysmal A. fib on rivaroxaban, dementia oriented to self at baseline presenting to the emergency department brought in by EMS from her memory care unit following a nonsyncopal fall in her bedroom onto hard floor complaining of vague generalized pain.  Per EMS, the facility staff heard the patient fall and she immediately yelled out afterwards.  EMS noted no abnormalities upon their initial evaluation or transport to the emergency department.  They noted that the patient was yelling out for her "daddy".  Additional history limited secondary to dementia  Per daughter, the patient had been sitting in her recliner prior to her fall. Complained of pain to her back and neck. Pt is new to her current facility.  Daughter notes the patient tends to become more agitated close to 5:00 PM.  She also easily becomes overwhelmed with many people around her in new environments tending to yell out.  History reviewed. No pertinent past medical history.  There are no problems to display for this patient.   History reviewed. No pertinent surgical history.   OB History   No obstetric history on file.     No family history on file.  Social History   Tobacco Use  . Smoking status: Not on file  Substance Use Topics  . Alcohol use: Not on file  . Drug use: Not on file    Home Medications Prior to Admission medications   Not on File    Allergies    Patient has no allergy information on record.  Review of Systems   Review of Systems  Unable to perform ROS: Dementia    Physical Exam Updated Vital Signs BP (!) 136/91   Pulse  (!) 50   Temp (!) 95.7 F (35.4 C) (Temporal)   Resp 20   Ht 5\' 3"  (1.6 m)   Wt 74.8 kg   SpO2 100%   BMI 29.23 kg/m   Physical Exam Vitals and nursing note reviewed.  Constitutional:      General: She is not in acute distress.    Appearance: She is normal weight. She is not ill-appearing or toxic-appearing.  HENT:     Head: Normocephalic and atraumatic.     Comments: Mid-face stable    Right Ear: External ear normal.     Left Ear: External ear normal.     Ears:     Comments: No Battle sign    Nose: Nose normal. No congestion or rhinorrhea.     Comments: No septal hematoma    Mouth/Throat:     Comments: No evidence of oropharyngeal trauma Eyes:     Extraocular Movements: Extraocular movements intact.     Pupils: Pupils are equal, round, and reactive to light.  Neck:     Comments: C-collar in place, no tenderness to palpation of C, T, L spine without step-offs or deformities, normal rectal tone Cardiovascular:     Rate and Rhythm: Normal rate and regular rhythm.     Pulses: Normal pulses.     Heart sounds: Normal heart sounds.  Pulmonary:     Effort: Pulmonary effort is normal.  No respiratory distress.     Breath sounds: Normal breath sounds. No wheezing or rhonchi.  Chest:     Chest wall: No tenderness.  Abdominal:     General: Abdomen is flat. Bowel sounds are normal.     Palpations: Abdomen is soft.     Tenderness: There is no abdominal tenderness. There is no guarding.  Musculoskeletal:     Comments: No tenderness to full body palpation  Skin:    General: Skin is warm and dry.     Capillary Refill: Capillary refill takes less than 2 seconds.     Comments: No evidence of traumatic injuries on skin inspection  Neurological:     General: No focal deficit present.     Mental Status: She is alert and oriented to person, place, and time. Mental status is at baseline.     ED Results / Procedures / Treatments   Labs (all labs ordered are listed, but only abnormal  results are displayed) Labs Reviewed - No data to display  EKG None  Radiology No results found.  Procedures Procedures (including critical care time)  Medications Ordered in ED Medications - No data to display  ED Course  I have reviewed the triage vital signs and the nursing notes.  Pertinent labs & imaging results that were available during my care of the patient were reviewed by me and considered in my medical decision making (see chart for details).    MDM Rules/Calculators/A&P                      86 year old female with a past medical history of paroxysmal A. fib on rivaroxaban, dementia oriented to self at baseline presenting to the emergency department brought in by EMS from her memory care unit following a nonsyncopal fall in her bedroom onto hard floor complaining of vague generalized pain.  Initially presented as a level 2 trauma deescalated after initial bedside evaluation.  Prior to arrival of the patient, the room was prepared with the following: code cart to bedside, glidescope, suction x1, BVM.  Upon arrival of the patient, EMS provided pertinent history and exam findings. The patient was transferred over to the trauma bed. ABCs intact as exam above. Once 2 IVs were placed, the secondary exam was performed. I performed the secondary exam from the head to the neck, and the resident on the trauma team performed the secondary exam from the neck down, and findings are noted above. Pertinent physical exam findings include no signs of trauma. Portable XRs performed at the bedside. eFAST exam not performed. The patient was then prepared and sent to the CT for full trauma scans. Patient started on IVF, IV antiemetics, and IV pain medications.   Chest x-ray and pelvis x-ray are unremarkable.  EKG interpreted by me demonstrates sinus rhythm at 61 bpm, normal axis, normal intervals, old anterior septal infarct, no new ST or T wave changes suggestive of acute ischemia, no  previous for comparison  Imaging interpreted by radiology.  Discussed patient's care with the patient's daughter, Ms. Reggy Eye, about our evaluation and her wishes in terms of further evaluation. After discussing her bedside evaluation without signs of external trauma or signs suggestive of serious intracranial injury including hematomas, step-offs, signs of basilar skull fracture or other traumatic injuries to the head or neck, the patient's daughter preferred that we discharge the patient back to her facility.  Following this shared decision making conversation, I feel comfortable that the patient is safe  and stable for discharge with return precautions and plan for follow-up care in place as needed.  The plan for this patient was discussed with Dr. Hyacinth Meeker, who voiced agreement and who oversaw evaluation and treatment of this patient.   Final Clinical Impression(s) / ED Diagnoses Final diagnoses:  Fall, initial encounter    Rx / DC Orders ED Discharge Orders    None       Gracy Bruins, MD 01/13/20 Michaelene Song    Eber Hong, MD 01/15/20 [phone]

## 2020-01-13 NOTE — ED Triage Notes (Signed)
Pt bib ems from memory care after unwitnessed fall. fentanyl

## 2020-01-13 NOTE — Progress Notes (Signed)
  Chaplain responded to this level 2 fall.  EMS reports patient fell at a LTC facility and the facility will call the family.  Chaplain went bedside to offer support for the patient and offered comfort as patient is being evaluated and in pain.  Chaplain available as needed.   Chaplain Agustin Cree, MDiv.     01/13/20 1810  Clinical Encounter Type  Visited With Patient  Visit Type ED;Trauma  Referral From Nurse  Consult/Referral To Chaplain

## 2020-01-13 NOTE — ED Notes (Signed)
Patient found standing in doorway without assistance attempting to leave; RN walked patient back to bed and asked to stay in bed; Pt is obviously confused and states her name is "Sylvia Davila"; Patient advised she is waiting for [facility] to transfer back to her facility; Patient placed in room with door open for easier view by RN; Charge made aware of patient fall or elopement risk due to dementia -[facility]

## 2020-01-13 NOTE — ED Notes (Signed)
EKG given to Dr. Miller.  

## 2020-01-13 NOTE — ED Notes (Signed)
Called [facility] for transportation  

## 2020-01-14 ENCOUNTER — Encounter (HOSPITAL_COMMUNITY): Payer: Self-pay | Admitting: *Deleted

## 2020-05-19 ENCOUNTER — Emergency Department (HOSPITAL_COMMUNITY): Payer: Medicare Other

## 2020-05-19 ENCOUNTER — Encounter (HOSPITAL_COMMUNITY): Payer: Self-pay | Admitting: Emergency Medicine

## 2020-05-19 ENCOUNTER — Other Ambulatory Visit: Payer: Self-pay

## 2020-05-19 ENCOUNTER — Inpatient Hospital Stay (HOSPITAL_COMMUNITY)
Admission: EM | Admit: 2020-05-19 | Discharge: 2020-05-22 | DRG: 308 | Disposition: A | Discharge status: Nursing Home (Includes ICF) | Payer: Medicare Other | Source: Skilled Nursing Facility | Attending: Internal Medicine | Admitting: Internal Medicine

## 2020-05-19 DIAGNOSIS — R5381 Other malaise: Secondary | ICD-10-CM

## 2020-05-19 DIAGNOSIS — J440 Chronic obstructive pulmonary disease with acute lower respiratory infection: Secondary | ICD-10-CM | POA: Diagnosis present

## 2020-05-19 DIAGNOSIS — Z20822 Contact with and (suspected) exposure to covid-19: Secondary | ICD-10-CM | POA: Diagnosis present

## 2020-05-19 DIAGNOSIS — Z8616 Personal history of COVID-19: Secondary | ICD-10-CM

## 2020-05-19 DIAGNOSIS — Z66 Do not resuscitate: Secondary | ICD-10-CM

## 2020-05-19 DIAGNOSIS — R001 Bradycardia, unspecified: Secondary | ICD-10-CM | POA: Diagnosis not present

## 2020-05-19 DIAGNOSIS — J189 Pneumonia, unspecified organism: Secondary | ICD-10-CM | POA: Diagnosis present

## 2020-05-19 DIAGNOSIS — Z7901 Long term (current) use of anticoagulants: Secondary | ICD-10-CM

## 2020-05-19 DIAGNOSIS — Z86711 Personal history of pulmonary embolism: Secondary | ICD-10-CM

## 2020-05-19 DIAGNOSIS — Z7984 Long term (current) use of oral hypoglycemic drugs: Secondary | ICD-10-CM

## 2020-05-19 DIAGNOSIS — T461X5A Adverse effect of calcium-channel blockers, initial encounter: Secondary | ICD-10-CM | POA: Diagnosis present

## 2020-05-19 DIAGNOSIS — Z888 Allergy status to other drugs, medicaments and biological substances status: Secondary | ICD-10-CM

## 2020-05-19 DIAGNOSIS — Y929 Unspecified place or not applicable: Secondary | ICD-10-CM

## 2020-05-19 DIAGNOSIS — R55 Syncope and collapse: Secondary | ICD-10-CM | POA: Diagnosis present

## 2020-05-19 DIAGNOSIS — Z7983 Long term (current) use of bisphosphonates: Secondary | ICD-10-CM

## 2020-05-19 DIAGNOSIS — F02818 Dementia in other diseases classified elsewhere, unspecified severity, with other behavioral disturbance: Secondary | ICD-10-CM

## 2020-05-19 DIAGNOSIS — I480 Paroxysmal atrial fibrillation: Secondary | ICD-10-CM | POA: Diagnosis present

## 2020-05-19 DIAGNOSIS — I712 Thoracic aortic aneurysm, without rupture: Secondary | ICD-10-CM | POA: Diagnosis present

## 2020-05-19 DIAGNOSIS — G309 Alzheimer's disease, unspecified: Secondary | ICD-10-CM | POA: Diagnosis present

## 2020-05-19 DIAGNOSIS — I719 Aortic aneurysm of unspecified site, without rupture: Secondary | ICD-10-CM | POA: Diagnosis present

## 2020-05-19 DIAGNOSIS — E785 Hyperlipidemia, unspecified: Secondary | ICD-10-CM | POA: Diagnosis present

## 2020-05-19 DIAGNOSIS — R410 Disorientation, unspecified: Secondary | ICD-10-CM

## 2020-05-19 DIAGNOSIS — D638 Anemia in other chronic diseases classified elsewhere: Secondary | ICD-10-CM | POA: Diagnosis present

## 2020-05-19 DIAGNOSIS — F0281 Dementia in other diseases classified elsewhere with behavioral disturbance: Secondary | ICD-10-CM | POA: Diagnosis present

## 2020-05-19 DIAGNOSIS — Z880 Allergy status to penicillin: Secondary | ICD-10-CM

## 2020-05-19 DIAGNOSIS — Z7189 Other specified counseling: Secondary | ICD-10-CM

## 2020-05-19 DIAGNOSIS — I10 Essential (primary) hypertension: Secondary | ICD-10-CM | POA: Diagnosis present

## 2020-05-19 DIAGNOSIS — I351 Nonrheumatic aortic (valve) insufficiency: Secondary | ICD-10-CM | POA: Diagnosis present

## 2020-05-19 DIAGNOSIS — Z79899 Other long term (current) drug therapy: Secondary | ICD-10-CM

## 2020-05-19 DIAGNOSIS — Z515 Encounter for palliative care: Secondary | ICD-10-CM

## 2020-05-19 DIAGNOSIS — M069 Rheumatoid arthritis, unspecified: Secondary | ICD-10-CM | POA: Diagnosis present

## 2020-05-19 LAB — CBC WITH DIFFERENTIAL/PLATELET
Abs Immature Granulocytes: 0.05 10*3/uL (ref 0.00–0.07)
Basophils Absolute: 0.1 10*3/uL (ref 0.0–0.1)
Basophils Relative: 1 %
Eosinophils Absolute: 0.2 10*3/uL (ref 0.0–0.5)
Eosinophils Relative: 2 %
HCT: 34.9 % — ABNORMAL LOW (ref 36.0–46.0)
Hemoglobin: 10.8 g/dL — ABNORMAL LOW (ref 12.0–15.0)
Immature Granulocytes: 1 %
Lymphocytes Relative: 17 %
Lymphs Abs: 1.4 10*3/uL (ref 0.7–4.0)
MCH: 31.1 pg (ref 26.0–34.0)
MCHC: 30.9 g/dL (ref 30.0–36.0)
MCV: 100.6 fL — ABNORMAL HIGH (ref 80.0–100.0)
Monocytes Absolute: 0.9 10*3/uL (ref 0.1–1.0)
Monocytes Relative: 10 %
Neutro Abs: 5.7 10*3/uL (ref 1.7–7.7)
Neutrophils Relative %: 69 %
Platelets: 269 10*3/uL (ref 150–400)
RBC: 3.47 MIL/uL — ABNORMAL LOW (ref 3.87–5.11)
RDW: 14.5 % (ref 11.5–15.5)
WBC: 8.2 10*3/uL (ref 4.0–10.5)
nRBC: 0 % (ref 0.0–0.2)

## 2020-05-19 LAB — BASIC METABOLIC PANEL
Anion gap: 12 (ref 5–15)
BUN: 28 mg/dL — ABNORMAL HIGH (ref 8–23)
CO2: 22 mmol/L (ref 22–32)
Calcium: 9 mg/dL (ref 8.9–10.3)
Chloride: 105 mmol/L (ref 98–111)
Creatinine, Ser: 1.14 mg/dL — ABNORMAL HIGH (ref 0.44–1.00)
GFR calc Af Amer: 49 mL/min — ABNORMAL LOW (ref >60)
GFR calc non Af Amer: 42 mL/min — ABNORMAL LOW (ref >60)
Glucose, Bld: 127 mg/dL — ABNORMAL HIGH (ref 70–99)
Potassium: 4.4 mmol/L (ref 3.5–5.1)
Sodium: 139 mmol/L (ref 135–145)

## 2020-05-19 LAB — TROPONIN I (HIGH SENSITIVITY)
Troponin I (High Sensitivity): 7 ng/L (ref <18)
Troponin I (High Sensitivity): 9 ng/L (ref <18)

## 2020-05-19 LAB — CBG MONITORING, ED: Glucose-Capillary: 117 mg/dL — ABNORMAL HIGH (ref 70–99)

## 2020-05-19 LAB — SARS CORONAVIRUS 2 BY RT PCR (HOSPITAL ORDER, PERFORMED IN ~~LOC~~ HOSPITAL LAB): SARS Coronavirus 2: NEGATIVE

## 2020-05-19 MED ORDER — DICLOFENAC SODIUM 1 % EX GEL
2.0000 g | Freq: Four times a day (QID) | CUTANEOUS | Status: DC | PRN
  Administered 2020-05-22: 2 g via TOPICAL
  Filled 2020-05-19: qty 100

## 2020-05-19 MED ORDER — IOHEXOL 350 MG/ML SOLN
75.0000 mL | Freq: Once | INTRAVENOUS | Status: AC | PRN
  Administered 2020-05-19: 51 mL via INTRAVENOUS

## 2020-05-19 MED ORDER — PANTOPRAZOLE SODIUM 40 MG PO TBEC
40.0000 mg | DELAYED_RELEASE_TABLET | Freq: Every day | ORAL | Status: DC
  Administered 2020-05-20 – 2020-05-22 (×3): 40 mg via ORAL
  Filled 2020-05-19 (×3): qty 1

## 2020-05-19 MED ORDER — LACTATED RINGERS IV BOLUS
500.0000 mL | Freq: Once | INTRAVENOUS | Status: AC
  Administered 2020-05-19: 500 mL via INTRAVENOUS

## 2020-05-19 MED ORDER — AMLODIPINE BESYLATE 5 MG PO TABS
5.0000 mg | ORAL_TABLET | Freq: Every morning | ORAL | Status: DC
  Administered 2020-05-20 – 2020-05-22 (×3): 5 mg via ORAL
  Filled 2020-05-19 (×4): qty 1

## 2020-05-19 MED ORDER — RIVAROXABAN 15 MG PO TABS
15.0000 mg | ORAL_TABLET | Freq: Every day | ORAL | Status: DC
  Administered 2020-05-19 – 2020-05-22 (×4): 15 mg via ORAL
  Filled 2020-05-19 (×4): qty 1

## 2020-05-19 MED ORDER — FUROSEMIDE 20 MG PO TABS
20.0000 mg | ORAL_TABLET | Freq: Every day | ORAL | Status: DC
  Administered 2020-05-20 – 2020-05-22 (×3): 20 mg via ORAL
  Filled 2020-05-19 (×3): qty 1

## 2020-05-19 MED ORDER — CALCIUM CARBONATE ANTACID 500 MG PO CHEW
500.0000 mg | CHEWABLE_TABLET | Freq: Every day | ORAL | Status: DC
  Administered 2020-05-20 – 2020-05-22 (×3): 500 mg via ORAL
  Filled 2020-05-19 (×3): qty 3

## 2020-05-19 MED ORDER — POLYETHYLENE GLYCOL 3350 17 G PO PACK: 17.0000 g | PACK | Freq: Every day | ORAL | Status: DC | PRN

## 2020-05-19 MED ORDER — FAMOTIDINE 10 MG PO TABS
10.0000 mg | ORAL_TABLET | Freq: Every day | ORAL | Status: DC | PRN
  Filled 2020-05-19: qty 1

## 2020-05-19 MED ORDER — SODIUM CHLORIDE 1 G PO TABS
1.0000 g | ORAL_TABLET | Freq: Every day | ORAL | Status: DC
  Administered 2020-05-20 – 2020-05-22 (×3): 1 g via ORAL
  Filled 2020-05-19 (×3): qty 1

## 2020-05-19 MED ORDER — DOCUSATE SODIUM 50 MG PO CAPS
50.0000 mg | ORAL_CAPSULE | Freq: Two times a day (BID) | ORAL | Status: DC | PRN
  Filled 2020-05-19: qty 1

## 2020-05-19 MED ORDER — PROCHLORPERAZINE MALEATE 10 MG PO TABS
10.0000 mg | ORAL_TABLET | Freq: Three times a day (TID) | ORAL | Status: DC | PRN
  Filled 2020-05-19: qty 1

## 2020-05-19 MED ORDER — OMEGA-3 1000 MG PO CAPS: 2000.0000 mg | ORAL_CAPSULE | Freq: Every morning | ORAL | Status: DC

## 2020-05-19 MED ORDER — ATORVASTATIN CALCIUM 10 MG PO TABS
20.0000 mg | ORAL_TABLET | Freq: Every day | ORAL | Status: DC
  Administered 2020-05-20 – 2020-05-22 (×3): 20 mg via ORAL
  Filled 2020-05-19 (×3): qty 2

## 2020-05-19 MED ORDER — ACETAMINOPHEN 325 MG PO TABS
650.0000 mg | ORAL_TABLET | Freq: Three times a day (TID) | ORAL | Status: DC | PRN
  Administered 2020-05-20 – 2020-05-22 (×4): 650 mg via ORAL
  Filled 2020-05-19 (×4): qty 2

## 2020-05-19 NOTE — ED Notes (Signed)
Patient given meal bag-Monique,RN

## 2020-05-19 NOTE — ED Provider Notes (Signed)
[facility] EMERGENCY DEPARTMENT Provider Note   CSN: [id number] Arrival date & time: 05/19/20  1250     History Chief Complaint  Patient presents with  . Loss of Consciousness    Sylvia Davila is a 86 y.o. female.   Loss of Consciousness Episode history:  Single Most recent episode:  Today Timing:  Constant Progression:  Resolved Chronicity:  New Context: sitting down   Witnessed: no   Relieved by:  Nothing Worsened by:  Nothing Ineffective treatments:  None tried Associated symptoms: shortness of breath   Associated symptoms: no chest pain and no fever        Past Medical History:  Diagnosis Date  . Atrial fibrillation (HCC)   . Barrett's esophagus   . COVID-19   . Dementia (HCC)   . Paroxysmal atrial fibrillation (HCC)   . Pulmonary emboli (HCC)   . Respiratory failure (HCC)     There are no problems to display for this patient.   No past surgical history on file.   OB History   No obstetric history on file.     No family history on file.  Social History   Tobacco Use  . Smoking status: Never Smoker  . Smokeless tobacco: Never Used  Substance Use Topics  . Alcohol use: Never  . Drug use: Never    Home Medications Prior to Admission medications   Medication Sig Start Date End Date Taking? Authorizing Provider  acetaminophen (TYLENOL) 650 MG CR tablet Take 1,300 mg by mouth every 8 (eight) hours as needed for pain.    [provider]  acetaminophen (TYLENOL) 650 MG CR tablet Take 650 mg by mouth every 8 (eight) hours as needed for pain.    [provider]  alendronate (FOSAMAX) 70 MG tablet Take 70 mg by mouth once a week. Monday 10/16/16   [provider]  alendronate (FOSAMAX) 70 MG tablet Take 70 mg by mouth every Monday. Take with a full glass of water on an empty stomach.    [provider]  amLODipine (NORVASC) 10 MG tablet Take 10 mg by mouth daily. 12/02/16   [provider]  amLODipine (NORVASC) 5 MG tablet Take 5 mg by mouth in the morning.    [provider]  ascorbic acid (VITAMIN C) 1000 MG tablet Take 1,000 mg by mouth daily at 12 noon.    [provider]  ascorbic acid (VITAMIN C) 1000 MG tablet Take 1,000 mg by mouth in the morning.    [provider]  atenolol (TENORMIN) 50 MG tablet Take 50 mg by mouth daily.  03/12/17   [provider]  atenolol (TENORMIN) 50 MG tablet Take 50 mg by mouth in the morning.    [provider]  atorvastatin (LIPITOR) 20 MG tablet Take 20 mg by mouth daily.    [provider]  atorvastatin (LIPITOR) 20 MG tablet Take 20 mg by mouth daily. 01/10/20   [provider]  Calcium Carbonate (CALCIUM 500 PO) Take 500 mg by mouth daily at 8 pm.    [provider]  Calcium Carbonate 500 MG CHEW Chew 500 mg by mouth daily. Tablet    [provider]  diclofenac Sodium (VOLTAREN) 1 % GEL Apply 2 g topically 4 (four) times daily.    [provider]  diclofenac Sodium (VOLTAREN) 1 % GEL Apply 2-4 g topically See admin instructions. Apply 2-4 grams three times a day to the right knee  [provider]  diltiazem (CARDIZEM) 120 MG tablet Take 120 mg by mouth in the morning.    [provider]  diltiazem (TIAZAC) 120 MG 24 hr capsule Take 120 mg by mouth daily. 08/18/19   [provider]  docusate sodium (COLACE CLEAR) 50 MG capsule Take 50 mg by mouth every 12 (twelve) hours as needed for mild constipation.    [provider]  docusate sodium (COLACE) 50 MG capsule Take 50 mg by mouth daily as needed for mild constipation.    [provider]  famotidine-calcium carbonate-magnesium hydroxide (PEPCID COMPLETE) 10-800-165 MG chewable tablet Chew 1 tablet by mouth daily as needed for heartburn.    [provider]  famotidine-calcium carbonate-magnesium hydroxide (PEPCID COMPLETE) 10-800-165 MG chewable  tablet Chew 1 tablet by mouth daily.    [provider]  folic acid (FOLVITE) 800 MCG tablet Take 800 mg by mouth daily.    [provider]  folic acid (FOLVITE) 800 MCG tablet Take 800 mcg by mouth in the morning.    [provider]  furosemide (LASIX) 20 MG tablet Take 20 mg by mouth daily.    [provider]  furosemide (LASIX) 20 MG tablet Take 20 mg by mouth in the morning.    [provider]  GLUCOSAMINE-CHONDROIT-VIT C-MN PO Take 1 tablet by mouth daily.    [provider]  Lecithin 1200 MG CAPS Take 1,200 mg by mouth daily.    [provider]  Lecithin 1200 MG CAPS Take 1,200 mg by mouth in the morning.    [provider]  LORazepam (ATIVAN) 0.5 MG tablet Take 0.5 mg by mouth daily.    [provider]  LORazepam (ATIVAN) 0.5 MG tablet Take 0.5 mg by mouth 3 (three) times daily.    [provider]  methotrexate (RHEUMATREX) 2.5 MG tablet Take 10 mg by mouth once a week. Caution:Chemotherapy. Protect from light. Take on Friday    [provider]  methotrexate (RHEUMATREX) 2.5 MG tablet Take 10 mg by mouth every Friday. Caution:Chemotherapy. Protect from light.    [provider]  Multiple Vitamin (MULTI-VITAMIN) tablet Take 1 tablet by mouth daily.    [provider]  Multiple Vitamins-Minerals (ONE-A-DAY WOMENS 50 PLUS) TABS Take 1 tablet by mouth daily with breakfast.    [provider]  nitrofurantoin, macrocrystal-monohydrate, (MACROBID) 100 MG capsule Take 1 capsule (100 mg total) by mouth 2 (two) times daily. 11/22/19   Lawyer, Cristal Deer, PA-C  Omega-3 1000 MG CAPS Take 2 g by mouth daily.    [provider]  Omega-3 1000 MG CAPS Take 2,000 mg by mouth in the morning.    [provider]  ondansetron (ZOFRAN) 4 MG tablet Take 4 mg by mouth every 8 (eight) hours as needed.    [provider]  ondansetron (ZOFRAN) 4 MG tablet Take 4 mg by  mouth every 8 (eight) hours as needed for nausea or vomiting.    [provider]  pantoprazole (PROTONIX) 20 MG tablet Take 20 mg by mouth daily.    [provider]  pantoprazole (PROTONIX) 20 MG tablet Take 20 mg by mouth in the morning.    [provider]  polyethylene glycol powder (GLYCOLAX/MIRALAX) 17 GM/SCOOP powder Take 17 g by mouth daily as needed.    [provider]  polyethylene glycol powder (GLYCOLAX/MIRALAX) 17 GM/SCOOP powder Take 17 g by mouth daily as needed (for constipation).    [provider]  potassium  chloride (KLOR-CON) 10 MEQ tablet Take 10 mEq by mouth daily.    [provider]  potassium chloride (KLOR-CON) 10 MEQ tablet Take 10 mEq by mouth in the morning.    [provider]  prochlorperazine (COMPAZINE) 10 MG tablet Take 10 mg by mouth every 8 (eight) hours as needed. 07/27/18   [provider]  psyllium (METAMUCIL) 58.6 % packet Take 1 packet by mouth See admin instructions. Mix the contents of 1 packet into 4-8 ounces of water and drink every morning    [provider]  Psyllium 100 % PACK Take 5 mLs by mouth every Thursday. 4-8 oz liquid    [provider]  rivaroxaban (XARELTO) 20 MG TABS tablet Take 20 mg by mouth daily with supper.    [provider]  rivaroxaban (XARELTO) 20 MG TABS tablet Take 20 mg by mouth every evening.    [provider]  sodium chloride 1 g tablet Take 1 g by mouth daily.    [provider]  sodium chloride 1 g tablet Take 1 g by mouth daily at 8 pm.    [provider]    Allergies    Meclizine, Penicillins, Meclizine, and Penicillins  Review of Systems   Review of Systems  Unable to perform ROS: Dementia  Constitutional: Negative for appetite change, chills and fever.  Respiratory: Positive for shortness of breath.   Cardiovascular: Positive for syncope. Negative for chest pain.  ros from family facility and  pt  Physical Exam Updated Vital Signs BP (!) 137/59 (BP Location: Right Arm)   Pulse (!) 50   Resp 16   SpO2 99%   Physical Exam Vitals and nursing note reviewed. Exam conducted with a chaperone present.  Constitutional:      General: She is not in acute distress.    Appearance: Normal appearance.  HENT:     Head: Normocephalic and atraumatic.     Nose: No rhinorrhea.  Eyes:     General:        Right eye: No discharge.        Left eye: No discharge.     Conjunctiva/sclera: Conjunctivae normal.  Cardiovascular:     Rate and Rhythm: Normal rate and regular rhythm.  Pulmonary:     Effort: Pulmonary effort is normal. No respiratory distress.     Breath sounds: No stridor.  Abdominal:     General: Abdomen is flat. There is no distension.     Palpations: Abdomen is soft.  Musculoskeletal:        General: No tenderness or signs of injury.  Skin:    General: Skin is warm and dry.  Neurological:     General: No focal deficit present.     Mental Status: She is alert. She is disoriented.     Sensory: No sensory deficit.     Motor: No weakness.  Psychiatric:        Mood and Affect: Mood normal.        Behavior: Behavior is cooperative.     ED Results / Procedures / Treatments   Labs (all labs ordered are listed, but only abnormal results are displayed) Labs Reviewed  BASIC METABOLIC PANEL - Abnormal; Notable for the following components:      Result Value   Glucose, Bld 127 (*)    BUN 28 (*)    Creatinine, Ser 1.14 (*)    GFR calc non Af Amer 42 (*)    GFR calc Af Denyse Dago  49 (*)    All other components within normal limits  CBC WITH DIFFERENTIAL/PLATELET - Abnormal; Notable for the following components:   RBC 3.47 (*)    Hemoglobin 10.8 (*)    HCT 34.9 (*)    MCV 100.6 (*)    All other components within normal limits  CBG MONITORING, ED - Abnormal; Notable for the following components:   Glucose-Capillary 117 (*)    All other components within normal limits  SARS  CORONAVIRUS 2 BY RT PCR (HOSPITAL ORDER, PERFORMED IN [facility])  URINALYSIS, ROUTINE W REFLEX MICROSCOPIC  TROPONIN I (HIGH SENSITIVITY)  TROPONIN I (HIGH SENSITIVITY)    EKG None  Radiology DG Knee Complete 4 Views Right  Result Date: 05/19/2020 CLINICAL DATA:  Knee pain EXAM: RIGHT KNEE - COMPLETE 4+ VIEW COMPARISON:  10/27/2018 FINDINGS: There is no acute fracture or dislocation. Joint effusion is present. There are advanced tricompartmental changes of osteoarthritis with severe joint space narrowing is medially. There are likely some calcified intra-articular loose bodies. Vascular calcification is noted. IMPRESSION: Advanced osteoarthritis. Probable calcified intra-articular loose bodies. Joint effusion. Electronically Signed   By: Guadlupe Spanish M.D.   On: 05/19/2020 14:08    Procedures Procedures (including critical care time)  Medications Ordered in ED Medications  lactated ringers bolus 500 mL (has no administration in time range)    ED Course  I have reviewed the triage vital signs and the nursing notes.  Pertinent labs & imaging results that were available during my care of the patient were reviewed by me and considered in my medical decision making (see chart for details).    MDM Rules/Calculators/A&P                          86 year old female comes in memory care facility after syncopal episode.  She was described to be sitting in a chair not responsive, lowered to the floor, had some myoclonic jerking. She comments on some SOB but no chest pain, she does not answer every question about history appropriately. I spoke with family and facility and there is no history of this nor preceding symptoms.  Patient EKG shows sinus bradycardia with no acute ischemic change interval abnormality or arrhythmia.  Reviewing EMS 3-lead it appears the same.  We will get CT PE study we will get troponin will get basic labs otherwise as well as a CT scan of the  head.  Knee x-ray by radiology myself shows no acute traumatic injury.  Still waiting CT head CT angio.  Troponin is detectable but negative.  Other lab studies are unremarkable for significant cause of her syncope she will be given some IV fluids as the creatinine is slightly elevated as is the BUN.  This may be sign slight dehydration .and likely admitted for further work-up of syncope of unknown cause.  Final Clinical Impression(s) / ED Diagnoses Final diagnoses:  Syncope and collapse    Rx / DC Orders ED Discharge Orders    None       Sabino Donovan, MD 05/19/20 [phone]

## 2020-05-19 NOTE — ED Notes (Signed)
Please call Reggy Eye (daughter) [phone] for update on pt.

## 2020-05-19 NOTE — H&P (Signed)
History and Physical    Sylvia Davila:[id number] DOB: [birth year] DOA: 05/19/2020  PCP: Patient, No Pcp Per (Confirm with patient/family/NH records and if not entered, this has to be entered at [facility] point of entry) Patient coming from: NH  I have personally briefly reviewed patient's old medical records in [facility]  Chief Complaint: Syncope  HPI: Sylvia Davila is a 86 y.o. female with medical history significant of advanced dementia, paroxysmal A. fib on Xarelto, hypertension, aortic aneurysm, rheumatoid arthritis, HTN, HLD, presented with syncope.  Patient has baseline dementia, very confused, most of the history provided by patient daughter over the phone.  Patient was diagnosed with paroxysmal A. fib and was started on Cardizem 120 mg daily since November 2020, along with Xarelto.  Patient recently was transferred to memory unit of [facility] from another nursing home because of the worsening of cognitive functions and frequent elopements.  Baseline patient uses walker to ambulate, no recent history of falls.  In January this year, according to cardiologist office visit record patient heart rate was 60s, and decision was made to continue same dosage of Cardizem.  This morning patient was found collapsed and unresponsive in the recliner, heart rate in the 30s.  On route to hospital patient recovered consciousness and had no specific complaints. ED Course: Heart rate in the 50s, CT head showed no acute bleeding were structural changes but chronic microvascular changes.  CT angiogram negative for PE.  Review of Systems: Unable to perform, patient confused   Past Medical History:  Diagnosis Date  . Atrial fibrillation (HCC)   . Barrett's esophagus   . COVID-19   . Dementia (HCC)   . Paroxysmal atrial fibrillation (HCC)   . Pulmonary emboli (HCC)   . Respiratory failure (HCC)     No past surgical history on file.   reports that she has never smoked. She has never used smokeless  tobacco. She reports that she does not drink alcohol and does not use drugs.  Allergies  Allergen Reactions  . Meclizine Other (See Comments)    "Allergic," per MAR  . Penicillins Other (See Comments)    "Allergic," per MAR  . Meclizine Rash  . Penicillins Rash    No family history on file.   Prior to Admission medications   Medication Sig Start Date End Date Taking? Authorizing Provider  acetaminophen (TYLENOL) 650 MG CR tablet Take 1,300 mg by mouth every 8 (eight) hours as needed for pain.    [provider]  acetaminophen (TYLENOL) 650 MG CR tablet Take 650 mg by mouth every 8 (eight) hours as needed for pain.    [provider]  alendronate (FOSAMAX) 70 MG tablet Take 70 mg by mouth once a week. Monday 10/16/16   [provider]  alendronate (FOSAMAX) 70 MG tablet Take 70 mg by mouth every Monday. Take with a full glass of water on an empty stomach.    [provider]  amLODipine (NORVASC) 10 MG tablet Take 10 mg by mouth daily. 12/02/16   [provider]  amLODipine (NORVASC) 5 MG tablet Take 5 mg by mouth in the morning.    [provider]  ascorbic acid (VITAMIN C) 1000 MG tablet Take 1,000 mg by mouth daily at 12 noon.    [provider]  ascorbic acid (VITAMIN C) 1000 MG tablet Take 1,000 mg by mouth in the morning.    [provider]  atenolol (TENORMIN) 50 MG tablet Take 50 mg by mouth  daily.  03/12/17   [provider]  atenolol (TENORMIN) 50 MG tablet Take 50 mg by mouth in the morning.    [provider]  atorvastatin (LIPITOR) 20 MG tablet Take 20 mg by mouth daily.    [provider]  atorvastatin (LIPITOR) 20 MG tablet Take 20 mg by mouth daily. 01/10/20   [provider]  Calcium Carbonate (CALCIUM 500 PO) Take 500 mg by mouth daily at 8 pm.    [provider]  Calcium Carbonate 500 MG CHEW Chew 500 mg by mouth daily. Tablet    [provider]    diclofenac Sodium (VOLTAREN) 1 % GEL Apply 2 g topically 4 (four) times daily.    [provider]  diclofenac Sodium (VOLTAREN) 1 % GEL Apply 2-4 g topically See admin instructions. Apply 2-4 grams three times a day to the right knee    [provider]  diltiazem (CARDIZEM) 120 MG tablet Take 120 mg by mouth in the morning.    [provider]  diltiazem (TIAZAC) 120 MG 24 hr capsule Take 120 mg by mouth daily. 08/18/19   [provider]  docusate sodium (COLACE CLEAR) 50 MG capsule Take 50 mg by mouth every 12 (twelve) hours as needed for mild constipation.    [provider]  docusate sodium (COLACE) 50 MG capsule Take 50 mg by mouth daily as needed for mild constipation.    [provider]  famotidine-calcium carbonate-magnesium hydroxide (PEPCID COMPLETE) 10-800-165 MG chewable tablet Chew 1 tablet by mouth daily as needed for heartburn.    [provider]  famotidine-calcium carbonate-magnesium hydroxide (PEPCID COMPLETE) 10-800-165 MG chewable tablet Chew 1 tablet by mouth daily.    [provider]  folic acid (FOLVITE) 800 MCG tablet Take 800 mg by mouth daily.    [provider]  folic acid (FOLVITE) 800 MCG tablet Take 800 mcg by mouth in the morning.    [provider]  furosemide (LASIX) 20 MG tablet Take 20 mg by mouth daily.    [provider]  furosemide (LASIX) 20 MG tablet Take 20 mg by mouth in the morning.    [provider]  GLUCOSAMINE-CHONDROIT-VIT C-MN PO Take 1 tablet by mouth daily.    [provider]  Lecithin 1200 MG CAPS Take 1,200 mg by mouth daily.    [provider]  Lecithin 1200 MG CAPS Take 1,200 mg by mouth in the morning.    [provider]  LORazepam (ATIVAN) 0.5 MG tablet Take 0.5 mg by mouth daily.    [provider]  LORazepam (ATIVAN) 0.5 MG tablet Take 0.5 mg by mouth 3 (three) times daily.    [provider]   methotrexate (RHEUMATREX) 2.5 MG tablet Take 10 mg by mouth once a week. Caution:Chemotherapy. Protect from light. Take on Friday    [provider]  methotrexate (RHEUMATREX) 2.5 MG tablet Take 10 mg by mouth every Friday. Caution:Chemotherapy. Protect from light.    [provider]  Multiple Vitamin (MULTI-VITAMIN) tablet Take 1 tablet by mouth daily.    [provider]  Multiple Vitamins-Minerals (ONE-A-DAY WOMENS 50 PLUS) TABS Take 1 tablet by mouth daily with breakfast.    [provider]  nitrofurantoin, macrocrystal-monohydrate, (MACROBID) 100 MG capsule Take 1 capsule (100 mg total) by mouth 2 (two) times daily. 11/22/19   Lawyer, Cristal Deer, PA-C  Omega-3 1000 MG CAPS Take 2 g by mouth daily.    [provider]  Omega-3 1000 MG CAPS Take 2,000 mg by mouth in the morning.    [provider]  ondansetron (ZOFRAN) 4 MG tablet Take 4 mg by mouth every 8 (eight) hours as needed.    [provider]  ondansetron (ZOFRAN) 4 MG tablet Take 4 mg by mouth every 8 (eight) hours as needed for nausea or vomiting.    [provider]  pantoprazole (PROTONIX) 20 MG tablet Take 20 mg by mouth daily.    [provider]  pantoprazole (PROTONIX) 20 MG tablet Take 20 mg by mouth in the morning.    [provider]  polyethylene glycol powder (GLYCOLAX/MIRALAX) 17 GM/SCOOP powder Take 17 g by mouth daily as needed.    [provider]  polyethylene glycol powder (GLYCOLAX/MIRALAX) 17 GM/SCOOP powder Take 17 g by mouth daily as needed (for constipation).    [provider]  potassium chloride (KLOR-CON) 10 MEQ tablet Take 10 mEq by mouth daily.    [provider]  potassium chloride (KLOR-CON) 10 MEQ tablet Take 10 mEq by mouth in the morning.    [provider]  prochlorperazine (COMPAZINE) 10 MG tablet Take 10 mg by mouth every 8 (eight) hours as needed. 07/27/18   [provider]   psyllium (METAMUCIL) 58.6 % packet Take 1 packet by mouth See admin instructions. Mix the contents of 1 packet into 4-8 ounces of water and drink every morning    [provider]  Psyllium 100 % PACK Take 5 mLs by mouth every Thursday. 4-8 oz liquid    [provider]  rivaroxaban (XARELTO) 20 MG TABS tablet Take 20 mg by mouth daily with supper.    [provider]  rivaroxaban (XARELTO) 20 MG TABS tablet Take 20 mg by mouth every evening.    [provider]  sodium chloride 1 g tablet Take 1 g by mouth daily.    [provider]  sodium chloride 1 g tablet Take 1 g by mouth daily at 8 pm.    [provider]    Physical Exam: Vitals:   05/19/20 1304  BP: (!) 137/59  Pulse: (!) 50  Resp: 16  SpO2: 99%    Constitutional: NAD, calm, comfortable Vitals:   05/19/20 1304  BP: (!) 137/59  Pulse: (!) 50  Resp: 16  SpO2: 99%   Eyes: PERRL, lids and conjunctivae normal ENMT: Mucous membranes are moist. Posterior pharynx clear of any exudate or lesions.Normal dentition.  Neck: normal, supple, no masses, no thyromegaly Respiratory: clear to auscultation bilaterally, no wheezing, no crackles. Normal respiratory effort. No accessory muscle use.  Cardiovascular: Regular rate and rhythm, no murmurs / rubs / gallops.  1+ extremity edema. 2+ pedal pulses. No carotid bruits.  Abdomen: no tenderness, no masses palpated. No hepatosplenomegaly. Bowel sounds positive.  Musculoskeletal: no clubbing / cyanosis. No joint deformity upper and lower extremities. Good ROM, no contractures. Normal muscle tone.  Skin: no rashes, lesions, ulcers. No induration Neurologic: Moving all limbs, following simple commands, no facial droops Psychiatric: Oriented to herself, confused about time and place    Labs on Admission: I have personally reviewed following labs and imaging studies  CBC: Recent Labs  Lab 05/19/20 1255  WBC 8.2  NEUTROABS 5.7  HGB 10.8*   HCT 34.9*  MCV 100.6*  PLT 269   Basic Metabolic Panel: Recent Labs  Lab 05/19/20 1255  NA 139  K 4.4  CL 105  CO2 22  GLUCOSE 127*  BUN 28*  CREATININE 1.14*  CALCIUM 9.0   GFR: CrCl cannot be calculated (Unknown ideal weight.). Liver Function Tests: No results for input(s): AST, ALT, ALKPHOS, BILITOT, PROT, ALBUMIN in the last 168 hours. No results for input(s): LIPASE, AMYLASE in the last 168 hours. No results for input(s): AMMONIA in the last 168 hours. Coagulation Profile: No results for input(s): INR, PROTIME in the last 168 hours. Cardiac Enzymes: No results for input(s): CKTOTAL, CKMB, CKMBINDEX, TROPONINI in the last 168 hours. BNP (last 3 results) No results for input(s): PROBNP in the last 8760 hours. HbA1C: No results for input(s): HGBA1C in the last 72 hours. CBG: Recent Labs  Lab 05/19/20 1301  GLUCAP 117*   Lipid Profile: No results for input(s): CHOL, HDL, LDLCALC, TRIG, CHOLHDL, LDLDIRECT in the last 72 hours. Thyroid Function Tests: No results for input(s): TSH, T4TOTAL, FREET4, T3FREE, THYROIDAB in the last 72 hours. Anemia Panel: No results for input(s): VITAMINB12, FOLATE, FERRITIN, TIBC, IRON, RETICCTPCT in the last 72 hours. Urine analysis:    Component Value Date/Time   COLORURINE YELLOW 11/22/2019 1206   APPEARANCEUR CLOUDY (A) 11/22/2019 1206   LABSPEC 1.012 11/22/2019 1206   PHURINE 9.0 (H) 11/22/2019 1206   GLUCOSEU NEGATIVE 11/22/2019 1206   HGBUR NEGATIVE 11/22/2019 1206   BILIRUBINUR NEGATIVE 11/22/2019 1206   KETONESUR NEGATIVE 11/22/2019 1206   PROTEINUR 100 (A) 11/22/2019 1206   UROBILINOGEN 0.2 04/29/2009 1144   NITRITE NEGATIVE 11/22/2019 1206   LEUKOCYTESUR LARGE (A) 11/22/2019 1206    Radiological Exams on Admission: CT HEAD WO CONTRAST  Result Date: 05/19/2020 CLINICAL DATA:  Seizure EXAM: CT HEAD WITHOUT CONTRAST TECHNIQUE: Contiguous axial images were obtained from the base of the skull through the vertex  without intravenous contrast. COMPARISON:  CT head 11/22/2019 FINDINGS: Brain: The CT head was performed 15 minutes after the CT angio chest. There is vascular contrast due to preceding CTA. Mild to moderate atrophy. Negative for hydrocephalus. Patchy white matter hypodensity bilaterally is unchanged and compatible with chronic microvascular ischemia. Negative for acute infarct, hemorrhage, mass. No enhancing lesion identified. Vascular: Diffuse vascular enhancement due to earlier contrast injection. Skull: Negative Sinuses/Orbits: Mucosal edema and air-fluid level in the sphenoid sinus. Remaining sinuses clear. Bilateral cataract extraction Other: None IMPRESSION: No acute abnormality. Atrophy and chronic microvascular ischemic change. No new finding since the prior CT. Electronically Signed   By: Marlan Palau M.D.   On: 05/19/2020 16:36   CT Angio Chest PE W and/or Wo Contrast  Result Date: 05/19/2020 CLINICAL DATA:  Shortness of breath, history of PE EXAM: CT ANGIOGRAPHY CHEST WITH CONTRAST TECHNIQUE: Multidetector CT imaging of the chest was performed using the standard protocol during bolus administration of intravenous contrast. Multiplanar CT image reconstructions and MIPs were obtained to evaluate the vascular anatomy. CONTRAST:  12mL OMNIPAQUE IOHEXOL 350 MG/ML SOLN COMPARISON:  2020 FINDINGS: Cardiovascular: Satisfactory opacification of the pulmonary arteries to the segmental level. No evidence of pulmonary embolism. Enlarged pulmonary arteries are unchanged. Ascending thoracic aorta again measures up to 4.5 cm. Stable heart size. No pericardial effusion. Mediastinum/Nodes: No enlarged lymph nodes. Stable appearance of thyroid. Lungs/Pleura: Left upper lobe mixed consolidative and ground-glass opacity remains present. Linear scarring or atelectasis at the lung bases. No pleural effusion or pneumothorax. Upper Abdomen: Unchanged moderate hiatal hernia. Musculoskeletal: No acute osseous abnormality.  Review of the MIP images confirms the above findings. IMPRESSION: No evidence of acute pulmonary embolism. Persistent left upper lobe consolidative and ground-glass opacity raising the possibility of malignancy. Stable aneurysmal dilatation  of the ascending thoracic aorta. Consider semi-annual imaging followup by CTA or MRA and referral to cardiothoracic surgery taking into account comorbidities and life expectancy. Enlarged pulmonary arteries reflecting pulmonary arterial hypertension. Electronically Signed   By: Guadlupe Spanish M.D.   On: 05/19/2020 16:27   DG Knee Complete 4 Views Right  Result Date: 05/19/2020 CLINICAL DATA:  Knee pain EXAM: RIGHT KNEE - COMPLETE 4+ VIEW COMPARISON:  10/27/2018 FINDINGS: There is no acute fracture or dislocation. Joint effusion is present. There are advanced tricompartmental changes of osteoarthritis with severe joint space narrowing is medially. There are likely some calcified intra-articular loose bodies. Vascular calcification is noted. IMPRESSION: Advanced osteoarthritis. Probable calcified intra-articular loose bodies. Joint effusion. Electronically Signed   By: Guadlupe Spanish M.D.   On: 05/19/2020 14:08    EKG: Pending  Assessment/Plan Active Problems:   Syncope  (please populate well all problems here in Problem List. (For example, if patient is on BP meds at home and you resume or decide to hold them, it is a problem that needs to be her. Same for CAD, COPD, HLD and so on)  Syncope -Likely from bradycardia, iatrogenic from Cardizem. -Hold Cardizem for now, consider low-dose of beta-blocker tomorrow for rate control -Most recent echocardiogram was 7 months ago showing EF 55 to 60%. -Telemetry monitor overnight, PT evaluation in the morning and then probably can discharge back to nursing home.  Plan explained and discussed with patient's daughter [name] over phone, all questions answered with my best knowledge.  HTN -Continue Norvasc and Lasix  HLD -On  statin  RA -Continue MTX  Dementia -Avoid benzo  DVT prophylaxis: Xarelto Code Status: DNR Family Communication: Daughter over the phone Disposition Plan: Expect overnight observation, once heart rate recovers and PT evaluation, patient can be discharged back to nursing home Consults called: None Admission status: Telemetry observation   Emeline General MD [facility] Pager [phone]  05/19/2020, 5:08 PM

## 2020-05-19 NOTE — ED Triage Notes (Signed)
PT arrived [facility] from [facility]. Per facility pt had syncopal episode while sitting in recliner.  Per EMS minimally unresponsive upon their arrival. Pt responsive one hospital arrival.  VSS.  No acute distress.

## 2020-05-20 ENCOUNTER — Inpatient Hospital Stay (HOSPITAL_COMMUNITY): Payer: Medicare Other

## 2020-05-20 DIAGNOSIS — Z66 Do not resuscitate: Secondary | ICD-10-CM | POA: Diagnosis present

## 2020-05-20 DIAGNOSIS — Z880 Allergy status to penicillin: Secondary | ICD-10-CM | POA: Diagnosis not present

## 2020-05-20 DIAGNOSIS — D638 Anemia in other chronic diseases classified elsewhere: Secondary | ICD-10-CM | POA: Diagnosis present

## 2020-05-20 DIAGNOSIS — J189 Pneumonia, unspecified organism: Secondary | ICD-10-CM | POA: Diagnosis present

## 2020-05-20 DIAGNOSIS — R55 Syncope and collapse: Secondary | ICD-10-CM | POA: Diagnosis present

## 2020-05-20 DIAGNOSIS — F0281 Dementia in other diseases classified elsewhere with behavioral disturbance: Secondary | ICD-10-CM | POA: Diagnosis present

## 2020-05-20 DIAGNOSIS — J440 Chronic obstructive pulmonary disease with acute lower respiratory infection: Secondary | ICD-10-CM | POA: Diagnosis present

## 2020-05-20 DIAGNOSIS — Z7983 Long term (current) use of bisphosphonates: Secondary | ICD-10-CM | POA: Diagnosis not present

## 2020-05-20 DIAGNOSIS — I351 Nonrheumatic aortic (valve) insufficiency: Secondary | ICD-10-CM | POA: Diagnosis present

## 2020-05-20 DIAGNOSIS — Z7189 Other specified counseling: Secondary | ICD-10-CM | POA: Diagnosis not present

## 2020-05-20 DIAGNOSIS — G309 Alzheimer's disease, unspecified: Secondary | ICD-10-CM | POA: Diagnosis present

## 2020-05-20 DIAGNOSIS — Z7984 Long term (current) use of oral hypoglycemic drugs: Secondary | ICD-10-CM | POA: Diagnosis not present

## 2020-05-20 DIAGNOSIS — Z86711 Personal history of pulmonary embolism: Secondary | ICD-10-CM | POA: Diagnosis not present

## 2020-05-20 DIAGNOSIS — T461X5A Adverse effect of calcium-channel blockers, initial encounter: Secondary | ICD-10-CM | POA: Diagnosis present

## 2020-05-20 DIAGNOSIS — R5381 Other malaise: Secondary | ICD-10-CM | POA: Diagnosis not present

## 2020-05-20 DIAGNOSIS — E785 Hyperlipidemia, unspecified: Secondary | ICD-10-CM | POA: Diagnosis present

## 2020-05-20 DIAGNOSIS — Z888 Allergy status to other drugs, medicaments and biological substances status: Secondary | ICD-10-CM | POA: Diagnosis not present

## 2020-05-20 DIAGNOSIS — R001 Bradycardia, unspecified: Secondary | ICD-10-CM | POA: Diagnosis present

## 2020-05-20 DIAGNOSIS — I480 Paroxysmal atrial fibrillation: Secondary | ICD-10-CM | POA: Diagnosis present

## 2020-05-20 DIAGNOSIS — Z79899 Other long term (current) drug therapy: Secondary | ICD-10-CM | POA: Diagnosis not present

## 2020-05-20 DIAGNOSIS — Z20822 Contact with and (suspected) exposure to covid-19: Secondary | ICD-10-CM | POA: Diagnosis present

## 2020-05-20 DIAGNOSIS — M069 Rheumatoid arthritis, unspecified: Secondary | ICD-10-CM | POA: Diagnosis present

## 2020-05-20 DIAGNOSIS — Y929 Unspecified place or not applicable: Secondary | ICD-10-CM | POA: Diagnosis not present

## 2020-05-20 DIAGNOSIS — Z7901 Long term (current) use of anticoagulants: Secondary | ICD-10-CM | POA: Diagnosis not present

## 2020-05-20 DIAGNOSIS — I10 Essential (primary) hypertension: Secondary | ICD-10-CM | POA: Diagnosis present

## 2020-05-20 DIAGNOSIS — Z8616 Personal history of COVID-19: Secondary | ICD-10-CM | POA: Diagnosis not present

## 2020-05-20 DIAGNOSIS — I719 Aortic aneurysm of unspecified site, without rupture: Secondary | ICD-10-CM | POA: Diagnosis present

## 2020-05-20 DIAGNOSIS — Z515 Encounter for palliative care: Secondary | ICD-10-CM | POA: Diagnosis not present

## 2020-05-20 LAB — CBC WITH DIFFERENTIAL/PLATELET
Abs Immature Granulocytes: 0.03 10*3/uL (ref 0.00–0.07)
Basophils Absolute: 0 10*3/uL (ref 0.0–0.1)
Basophils Relative: 1 %
Eosinophils Absolute: 0.1 10*3/uL (ref 0.0–0.5)
Eosinophils Relative: 1 %
HCT: 33.6 % — ABNORMAL LOW (ref 36.0–46.0)
Hemoglobin: 10.6 g/dL — ABNORMAL LOW (ref 12.0–15.0)
Immature Granulocytes: 0 %
Lymphocytes Relative: 15 %
Lymphs Abs: 1 10*3/uL (ref 0.7–4.0)
MCH: 30.2 pg (ref 26.0–34.0)
MCHC: 31.5 g/dL (ref 30.0–36.0)
MCV: 95.7 fL (ref 80.0–100.0)
Monocytes Absolute: 0.7 10*3/uL (ref 0.1–1.0)
Monocytes Relative: 10 %
Neutro Abs: 4.9 10*3/uL (ref 1.7–7.7)
Neutrophils Relative %: 73 %
Platelets: 254 10*3/uL (ref 150–400)
RBC: 3.51 MIL/uL — ABNORMAL LOW (ref 3.87–5.11)
RDW: 14.3 % (ref 11.5–15.5)
WBC: 6.8 10*3/uL (ref 4.0–10.5)
nRBC: 0 % (ref 0.0–0.2)

## 2020-05-20 LAB — BASIC METABOLIC PANEL
Anion gap: 9 (ref 5–15)
BUN: 21 mg/dL (ref 8–23)
CO2: 23 mmol/L (ref 22–32)
Calcium: 9 mg/dL (ref 8.9–10.3)
Chloride: 106 mmol/L (ref 98–111)
Creatinine, Ser: 0.96 mg/dL (ref 0.44–1.00)
GFR calc Af Amer: 60 mL/min — ABNORMAL LOW (ref >60)
GFR calc non Af Amer: 52 mL/min — ABNORMAL LOW (ref >60)
Glucose, Bld: 113 mg/dL — ABNORMAL HIGH (ref 70–99)
Potassium: 4.1 mmol/L (ref 3.5–5.1)
Sodium: 138 mmol/L (ref 135–145)

## 2020-05-20 LAB — ECHOCARDIOGRAM COMPLETE
Area-P 1/2: 2.83 cm2
P 1/2 time: 848 msec
S' Lateral: 3 cm

## 2020-05-20 LAB — URINALYSIS, ROUTINE W REFLEX MICROSCOPIC
Bilirubin Urine: NEGATIVE
Glucose, UA: NEGATIVE mg/dL
Hgb urine dipstick: NEGATIVE
Ketones, ur: NEGATIVE mg/dL
Nitrite: NEGATIVE
Protein, ur: NEGATIVE mg/dL
Specific Gravity, Urine: 1.016 (ref 1.005–1.030)
pH: 8 (ref 5.0–8.0)

## 2020-05-20 MED ORDER — LORAZEPAM 0.5 MG PO TABS
0.5000 mg | ORAL_TABLET | Freq: Three times a day (TID) | ORAL | Status: DC
  Administered 2020-05-20 – 2020-05-22 (×6): 0.5 mg via ORAL
  Filled 2020-05-20 (×6): qty 1

## 2020-05-20 MED ORDER — SODIUM CHLORIDE 0.9 % IV SOLN
500.0000 mg | INTRAVENOUS | Status: DC
  Filled 2020-05-20: qty 500

## 2020-05-20 MED ORDER — FOLIC ACID 1 MG PO TABS
1.0000 mg | ORAL_TABLET | Freq: Every day | ORAL | Status: DC
  Administered 2020-05-20 – 2020-05-22 (×3): 1 mg via ORAL
  Filled 2020-05-20 (×3): qty 1

## 2020-05-20 MED ORDER — AZITHROMYCIN 250 MG PO TABS
500.0000 mg | ORAL_TABLET | Freq: Every day | ORAL | Status: DC
  Administered 2020-05-20 – 2020-05-22 (×3): 500 mg via ORAL
  Filled 2020-05-20 (×3): qty 2

## 2020-05-20 NOTE — Progress Notes (Addendum)
PROGRESS NOTE    [name]  BDZ:[id number] DOB: 10/20/1926 DOA: 05/19/2020 PCP: Patient, No Pcp Per    Chief Complaint  Patient presents with  . Loss of Consciousness    Brief Narrative:  86 year old lady with prior history of dementia, paroxysmal atrial fibrillation on Xarelto, hypertension, rheumatoid arthritis, high hyperlipidemia, aortic aneurysm presented with syncope.  As per the daughter patient has dementia is confused and becomes aggressive sometimes due to worsening cognitive deficits and had frequent elopements. Prior to admission patient was found collapsed and unresponsive in the recliner.  On further evaluation she was found to be bradycardic with heart rate in 30s.  Minute On arrival to ED heart rate improved to 50s per minute, CT of the head does not show any acute intracranial abnormalities but shows chronic microvascular changes consistent with dementia. CT angiogram of the chest is negative for PE.  Blocker were stopped and the heart rate has been stable in the low 50s.    Assessment & Plan:   Active Problems:   Syncope and collapse  Syncope probably secondary to bradycardia from Cardizem and beta-blocker.  Patient's heart rate still in low 50s continue to hold both beta-blocker and Cardizem for now. Echocardiogram this admission showed preserved left ventricular ejection fraction, without significant abnormalities. PT evaluation recommended SNF.    Essential hypertension Continue with home Norvasc.  Next   Paroxysmal atrial fibrillation Heart rate in low 50s.  Beta-blocker and Cardizem on hold and continue with Xarelto for anticoagulation.  Dementia with behavioral abnormalities Patient appears to be stable at this time without any agitation.    Hyperlipidemia Continue with statin.   Incidental finding of right upper lobe opacity on the CT suspicious for malignancy versus pneumonia. Empirically treating for pneumonia.  Discussed with the  patient's daughter that it could be malignant. Recommended to follow-up with PCP outpatient repeat CT scan of the chest and further work-up with biopsy if daughter wants to prefer aggressive management.  DVT prophylaxis: xarelto Code Status: DNR Family Communication: Disposition:   Status is: Observation  The patient will require care spanning > 2 midnights and should be moved to inpatient because: Unsafe d/c plan and Inpatient level of care appropriate due to severity of illness  Dispo: The patient is from: ALF              Anticipated d/c is to: PENDING              Anticipated d/c date is: 2 days              Patient currently is not medically stable to d/c.       Consultants:   NONE   Procedures: echo  Antimicrobials: ROCEPHIN ans zithromax  Subjective: Pt sleeping comfortably. Opens eyes briefly and closes them.   Objective: Vitals:   05/19/20 2338 05/20/20 0051 05/20/20 0642 05/20/20 0728  BP: (!) 141/58 (!) 137/51 (!) 130/47 (!) 111/53  Pulse: (!) 53 (!) 52 61 (!) 58  Resp: 14 18  16   Temp: 98 F (36.7 C) 98.1 F (36.7 C)  98.1 F (36.7 C)  TempSrc:  Oral    SpO2: 97% 97% 98% 96%    Intake/Output Summary (Last 24 hours) at 05/20/2020 0915 Last data filed at 05/20/2020 0100 Gross per 24 hour  Intake 1000 ml  Output 1 ml  Net 999 ml   There were no vitals filed for this visit.  Examination:  General exam: Appears calm and comfortable  Respiratory system: Clear  to auscultation. Respiratory effort normal. Cardiovascular system: S1 & S2 heard,  Bradycardic. Marland Kitchen No JVD, murmurs, No pedal edema. Gastrointestinal system: Abdomen is nondistended, soft and nontender.  Normal bowel sounds heard. Central nervous system: sleepy, lethargic. Cannot be assessed.  Extremities: no cyanosis or clubbing.  Skin: No rashes, lesions or ulcers Psychiatry: Mood & affect appropriate.     Data Reviewed: I have personally reviewed following labs and imaging  studies  CBC: Recent Labs  Lab 05/19/20 1255  WBC 8.2  NEUTROABS 5.7  HGB 10.8*  HCT 34.9*  MCV 100.6*  PLT 269    Basic Metabolic Panel: Recent Labs  Lab 05/19/20 1255  NA 139  K 4.4  CL 105  CO2 22  GLUCOSE 127*  BUN 28*  CREATININE 1.14*  CALCIUM 9.0    GFR: CrCl cannot be calculated (Unknown ideal weight.).  Liver Function Tests: No results for input(s): AST, ALT, ALKPHOS, BILITOT, PROT, ALBUMIN in the last 168 hours.  CBG: Recent Labs  Lab 05/19/20 1301  GLUCAP 117*     Recent Results (from the past 240 hour(s))  SARS Coronavirus 2 by RT PCR (hospital order, performed in [facility] hospital lab) Nasopharyngeal Nasopharyngeal Swab     Status: None   Collection Time: 05/19/20 12:56 PM   Specimen: Nasopharyngeal Swab  Result Value Ref Range Status   SARS Coronavirus 2 NEGATIVE NEGATIVE Final    Comment: (NOTE) SARS-CoV-2 target nucleic acids are NOT DETECTED.  The SARS-CoV-2 RNA is generally detectable in upper and lower respiratory specimens during the acute phase of infection. The lowest concentration of SARS-CoV-2 viral copies this assay can detect is 250 copies / mL. A negative result does not preclude SARS-CoV-2 infection and should not be used as the sole basis for treatment or other patient management decisions.  A negative result may occur with improper specimen collection / handling, submission of specimen other than nasopharyngeal swab, presence of viral mutation(s) within the areas targeted by this assay, and inadequate number of viral copies (<250 copies / mL). A negative result must be combined with clinical observations, patient history, and epidemiological information.  Fact Sheet for Patients:   BoilerBrush.com.cy  Fact Sheet for Healthcare Providers: https://pope.com/  This test is not yet approved or  cleared by the [place] FDA and has been authorized for detection and/or  diagnosis of SARS-CoV-2 by FDA under an Emergency Use Authorization (EUA).  This EUA will remain in effect (meaning this test can be used) for the duration of the COVID-19 declaration under Section 564(b)(1) of the Act, 21 U.S.C. section 360bbb-3(b)(1), unless the authorization is terminated or revoked sooner.  Performed at [facility], [address], [place], [place] [address]          Radiology Studies: CT HEAD WO CONTRAST  Result Date: 05/19/2020 CLINICAL DATA:  Seizure EXAM: CT HEAD WITHOUT CONTRAST TECHNIQUE: Contiguous axial images were obtained from the base of the skull through the vertex without intravenous contrast. COMPARISON:  CT head 11/22/2019 FINDINGS: Brain: The CT head was performed 15 minutes after the CT angio chest. There is vascular contrast due to preceding CTA. Mild to moderate atrophy. Negative for hydrocephalus. Patchy white matter hypodensity bilaterally is unchanged and compatible with chronic microvascular ischemia. Negative for acute infarct, hemorrhage, mass. No enhancing lesion identified. Vascular: Diffuse vascular enhancement due to earlier contrast injection. Skull: Negative Sinuses/Orbits: Mucosal edema and air-fluid level in the sphenoid sinus. Remaining sinuses clear. Bilateral cataract extraction Other: None IMPRESSION: No acute  abnormality. Atrophy and chronic microvascular ischemic change. No new finding since the prior CT. Electronically Signed   By: Marlan Palau M.D.   On: 05/19/2020 16:36   CT Angio Chest PE W and/or Wo Contrast  Result Date: 05/19/2020 CLINICAL DATA:  Shortness of breath, history of PE EXAM: CT ANGIOGRAPHY CHEST WITH CONTRAST TECHNIQUE: Multidetector CT imaging of the chest was performed using the standard protocol during bolus administration of intravenous contrast. Multiplanar CT image reconstructions and MIPs were obtained to evaluate the vascular anatomy. CONTRAST:  5mL OMNIPAQUE IOHEXOL 350 MG/ML SOLN COMPARISON:   2020 FINDINGS: Cardiovascular: Satisfactory opacification of the pulmonary arteries to the segmental level. No evidence of pulmonary embolism. Enlarged pulmonary arteries are unchanged. Ascending thoracic aorta again measures up to 4.5 cm. Stable heart size. No pericardial effusion. Mediastinum/Nodes: No enlarged lymph nodes. Stable appearance of thyroid. Lungs/Pleura: Left upper lobe mixed consolidative and ground-glass opacity remains present. Linear scarring or atelectasis at the lung bases. No pleural effusion or pneumothorax. Upper Abdomen: Unchanged moderate hiatal hernia. Musculoskeletal: No acute osseous abnormality. Review of the MIP images confirms the above findings. IMPRESSION: No evidence of acute pulmonary embolism. Persistent left upper lobe consolidative and ground-glass opacity raising the possibility of malignancy. Stable aneurysmal dilatation of the ascending thoracic aorta. Consider semi-annual imaging followup by CTA or MRA and referral to cardiothoracic surgery taking into account comorbidities and life expectancy. Enlarged pulmonary arteries reflecting pulmonary arterial hypertension. Electronically Signed   By: Guadlupe Spanish M.D.   On: 05/19/2020 16:27   DG Knee Complete 4 Views Right  Result Date: 05/19/2020 CLINICAL DATA:  Knee pain EXAM: RIGHT KNEE - COMPLETE 4+ VIEW COMPARISON:  10/27/2018 FINDINGS: There is no acute fracture or dislocation. Joint effusion is present. There are advanced tricompartmental changes of osteoarthritis with severe joint space narrowing is medially. There are likely some calcified intra-articular loose bodies. Vascular calcification is noted. IMPRESSION: Advanced osteoarthritis. Probable calcified intra-articular loose bodies. Joint effusion. Electronically Signed   By: Guadlupe Spanish M.D.   On: 05/19/2020 14:08        Scheduled Meds: . amLODipine  5 mg Oral q AM  . atorvastatin  20 mg Oral Daily  . calcium carbonate  500 mg Oral Daily  .  furosemide  20 mg Oral Daily  . pantoprazole  40 mg Oral Daily  . Rivaroxaban  15 mg Oral Q supper  . sodium chloride  1 g Oral Q2000   Continuous Infusions:   LOS: 0 days        Kathlen Mody, MD [facility]   To contact the attending provider between 7A-7P or the covering provider during after hours 7P-7A, please log into the web site [email or web address] and access using universal [place] password for that web site. If you do not have the password, please call the hospital operator.  05/20/2020, 9:15 AM

## 2020-05-20 NOTE — Evaluation (Signed)
Physical Therapy Evaluation Patient Details Name: Sylvia Davila MRN: [id number] DOB: Jun 15, 1927 Today's Date: 05/20/2020   History of Present Illness  Sylvia Davila is a 86 y.o. female with medical history significant of advanced dementia, paroxysmal A. fib on Xarelto, hypertension, aortic aneurysm, rheumatoid arthritis, HTN, HLD, presented with syncope    Clinical Impression  Pt happy and hyper at beginning of eval.  Orthostatics - supine 134/65 (52), sitting 123/80 (65), standing 96/59 (142) - feels OK.  Pt toileted and I took off wet sheets.  She then agreed to walk - feeling good.  End of walk (80 feet) she was fatiqued - BP 94/58 (70).  I put on dry sheets and she reports she was about to fall through the floor - BP 96/66 (75).  Pt returned supine and worn out and sleep. She was safe walking with assist and RW.  Pt limited by her vitals.  PT will follow her on acute.    Follow Up Recommendations SNF;Supervision/Assistance - 24 hour    Equipment Recommendations  None recommended by PT (unsure what pt uses at ALF)    Recommendations for Other Services       Precautions / Restrictions Precautions Precautions: Fall Precaution Comments: Pt has orthostatic hypotension Restrictions Weight Bearing Restrictions: No      Mobility  Bed Mobility Overal bed mobility: Needs Assistance Bed Mobility: Sidelying to Sit   Sidelying to sit: Mod assist       General bed mobility comments: pt sat up with cues and assist  Transfers Overall transfer level: Needs assistance Equipment used: Rolling walker (2 wheeled) Transfers: Sit to/from Stand Sit to Stand: Min assist         General transfer comment: Pt stood from bed and toileted on low toilet - she needed max cues for hand placement and what to do  Ambulation/Gait Ambulation/Gait assistance: Min assist Gait Distance (Feet): 80 Feet Assistive device: Rolling walker (2 wheeled) Gait Pattern/deviations: Decreased stride  length;Step-through pattern;Trunk flexed     General Gait Details: pt with flexed posture but no loss of balance - pt fatiqued easily  Careers information officer    Modified Rankin (Stroke Patients Only)       Balance Overall balance assessment: History of Falls;Mild deficits observed, not formally tested                                           Pertinent Vitals/Pain Pain Assessment: No/denies pain    Home Living Family/patient expects to be discharged to:: Assisted living               Home Equipment: Walker - 2 wheels      Prior Function           Comments: pt has dementia - unsure of status at ALF     Hand Dominance        Extremity/Trunk Assessment        Lower Extremity Assessment Lower Extremity Assessment: Generalized weakness (Pt has arthritis in B knees - knee extension PROM -20 B)    Cervical / Trunk Assessment Cervical / Trunk Assessment: Kyphotic  Communication   Communication: No difficulties  Cognition Arousal/Alertness: Awake/alert Behavior During Therapy: Impulsive Overall Cognitive Status: History of cognitive impairments - at baseline  General Comments: Pt started out impulsive and hyper - moving all over the bed.  At end of session - pt with eyes closed and lethargic - BP lower.      General Comments General comments (skin integrity, edema, etc.): supine 134/65 pules52, sitting 123/80 pulse 65, standing 96/59 pulse 142, after walking 94/58 pulse 70, standing to change bed 96/66 (75) pt reports she is about to fall through the floor.    Exercises     Assessment/Plan    PT Assessment Patient needs continued PT services  PT Problem List Decreased strength;Decreased mobility;Cardiopulmonary status limiting activity;Decreased activity tolerance;Decreased safety awareness       PT Treatment Interventions      PT Goals (Current goals can be  found in the Care Plan section)  Acute Rehab PT Goals Patient Stated Goal: not defined PT Goal Formulation: Patient unable to participate in goal setting Time For Goal Achievement: 05/26/20 Potential to Achieve Goals: Good    Frequency Min 3X/week   Barriers to discharge        Co-evaluation               AM-PAC PT "6 Clicks" Mobility  Outcome Measure Help needed turning from your back to your side while in a flat bed without using bedrails?: A Little Help needed moving from lying on your back to sitting on the side of a flat bed without using bedrails?: A Little Help needed moving to and from a bed to a chair (including a wheelchair)?: A Little Help needed standing up from a chair using your arms (e.g., wheelchair or bedside chair)?: A Little Help needed to walk in hospital room?: A Little Help needed climbing 3-5 steps with a railing? : Total 6 Click Score: 16    End of Session Equipment Utilized During Treatment: Gait belt Activity Tolerance: Treatment limited secondary to medical complications (Comment) Patient left: in bed;with bed alarm set;with call bell/phone within reach Nurse Communication: Mobility status;Precautions PT Visit Diagnosis: Unsteadiness on feet (R26.81);Repeated falls (R29.6);Difficulty in walking, not elsewhere classified (R26.2)    Time: 1400-1450 PT Time Calculation (min) (ACUTE ONLY): 50 min   Charges:   PT Evaluation $PT Eval Moderate Complexity: 1 Mod PT Treatments $Gait Training: 8-22 mins $Therapeutic Activity: 8-22 mins        05/20/2020   Ranae Palms, PT   Judson Roch 05/20/2020, 2:59 PM

## 2020-05-20 NOTE — Progress Notes (Signed)
  Echocardiogram 2D Echocardiogram has been performed.  Sylvia Davila 05/20/2020, 3:26 PM

## 2020-05-21 DIAGNOSIS — Z66 Do not resuscitate: Secondary | ICD-10-CM

## 2020-05-21 DIAGNOSIS — R410 Disorientation, unspecified: Secondary | ICD-10-CM

## 2020-05-21 DIAGNOSIS — Z7189 Other specified counseling: Secondary | ICD-10-CM

## 2020-05-21 DIAGNOSIS — G309 Alzheimer's disease, unspecified: Secondary | ICD-10-CM

## 2020-05-21 DIAGNOSIS — F0281 Dementia in other diseases classified elsewhere with behavioral disturbance: Secondary | ICD-10-CM

## 2020-05-21 DIAGNOSIS — R5381 Other malaise: Secondary | ICD-10-CM

## 2020-05-21 DIAGNOSIS — Z515 Encounter for palliative care: Secondary | ICD-10-CM

## 2020-05-21 MED ORDER — SERTRALINE HCL 50 MG PO TABS
75.0000 mg | ORAL_TABLET | Freq: Every day | ORAL | Status: DC
  Administered 2020-05-21 – 2020-05-22 (×2): 75 mg via ORAL
  Filled 2020-05-21 (×2): qty 1

## 2020-05-21 NOTE — Consult Note (Signed)
Palliative Medicine Inpatient Consult Note  Reason for consult:  Goals of Care  HPI:  Per intake H&P --> Sylvia Davila is a 86 y.o. female with medical history significant of advanced dementia, paroxysmal A. fib on Xarelto, hypertension, aortic aneurysm, rheumatoid arthritis, HTN, HLD, presented with syncope.  Patient has baseline dementia, very confused, most of the history provided by patient daughter over the phone.  Patient was diagnosed with paroxysmal A. fib and was started on Cardizem 120 mg daily since November 2020, along with Xarelto.   Palliative care was asked to get involved to aid in goals of care conversations in the setting of progressive dementia.   Clinical Assessment/Goals of Care: I have reviewed medical records including EPIC notes, labs and imaging, received report from bedside RN, assessed the patient.    I called Sylvia Davila  to further discuss diagnosis prognosis, GOC, EOL wishes, disposition and options.   I introduced Palliative Medicine as specialized medical care for people living with serious illness. It focuses on providing relief from the symptoms and stress of a serious illness. The goal is to improve quality of life for both the patient and the family.  Sylvia Davila shares with me that her mother is from [place] originally. She moved to [place] about fifteen years ago. She was married twice, each marriage for about fifteen years. She recently lost her partner. She has four children though one of her daughters has passed away. 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 Sylvia Davila was known to be a Tax adviser independent woman who "never sat down".  She volunteered at [employer] for many years. She is a woman of faith. She was raised as a Protestant though since locating here has been practicing within the [employer].  In regards to Sylvia Davila's functional state, up until about one year ago in  November she was fully independent. It was at this time that she suffered  bilateral PE's and required hospitalization. Per her daughter, Sylvia Davila this led to her "downfall." Since then she has been to multiple SNF's and ultimately has ended up at [facility] dementia care assisted living facility.Sylvia Davila recounts that Sylvia Davila is only able to "pace the halls" and feed herself. She is otherwise dependent for all bADL's.   A detailed discussion was had today regarding advanced directives, Sylvia Davila shares that her mother had completed a living will she endorses that she will email [place] a copy of this in addition to [facility] documentation.   Concepts specific to code status, artifical feeding and hydration, continued IV antibiotics and rehospitalization was had. We discussed a MOST form and reviewed the sections over the telephone. Sylvia Davila states that she would like to review this mor formally with her husband prior to making any decisions. She did state that per Sylvia Davila's living will she would not wish for her to have resuscitation - she is a confirmed DNAR/DNI and would not want heroic interventions to remain living.   The difference between a aggressive medical intervention path  and a palliative comfort care path for this patient at this time was had. We reviewed the goals of OP Palliative care and OP hospice care. At this point in time Sylvia Davila feels that her mother is too well functioning for hospice though she is open to outpatient Palliative support.   Discussed the importance of continued conversation with family and their  medical providers regarding overall plan of care and treatment options,  ensuring decisions are within the context of the patients values and GOCs.  Decision Maker: Sylvia Davila (Daughter) [phone] / [phone]  SUMMARY OF RECOMMENDATIONS   DNAR/DNI  Treat what is treatable  Requested a copy of advanced directives to have scanned into Vynca  TOC --> OP Palliative  Care  Chaplain Support  PMT will remain to follow along incrementally - Please contact us directly for any urgent needs  Code Status/Advance Care Planning: DNAR/DNI   Symptom Management:  Generalized Deconditioning:    - Physical Therapy Evaluation                - Occupational Therapy Evaluation   Falls: - Physical activity - Ensure wearing eyeglasses or contact lenses - Ensure wearing hearing aid - Find out about the side effects of any medicine you take - Ensure adequate sleep - Stand up slowly - Use an assistive devices  - Wear non-skid, rubber-soled, low-heeled shoes, or lace-up shoes with non-skid soles that  fully support feet.   Dementia with Behavioral Disturbances:  - Ativan 0.5mg  PO TID --> this has worked well for the patient per her daughter  Delirium:                 - Delirium precautions                 - Get up during the day                 - Encourage a familiar face to remain present throughout the day                 - Keep blinds open and lights on during daylight hours                 - Minimize the use of opioids/benzodiazepines  Palliative Prophylaxis:   Oral Care, Aspiration Precautions, Mobility   Additional Recommendations (Limitations, Scope, Preferences): Treat what is treatable   Psycho-social/Spiritual:   Desire for further Chaplaincy support: Yes  Additional Recommendations:  Education on OP Palliative Care   Prognosis: Unclear at this juncture   Discharge Planning: Discharge to [facility] with OP Palliative Care  PPS: 50-60%   This conversation/these recommendations were discussed with patient primary care team, Dr. Blake Divine  Time In: 0845 Time Out: 1000 Total Time: 75 minutes Greater than 50%  of this time was spent counseling and coordinating care related to the above assessment and plan.  Sylvia Davila [place] Palliative Medicine Team Team Cell Phone: [phone] Please utilize secure chat with additional  questions, if there is no response within 30 minutes please call the above phone number  Palliative Medicine Team providers are available by phone from 7am to 7pm daily and can be reached through the team cell phone.  Should this patient require assistance outside of these hours, please call the patient's attending physician.

## 2020-05-21 NOTE — Progress Notes (Addendum)
PROGRESS NOTE    Sylvia Davila  OHY:[id number] DOB: [birth year] DOA: 05/19/2020 PCP: Patient, No Pcp Per    Chief Complaint  Patient presents with  . Loss of Consciousness    Brief Narrative:  86 year old lady with prior history of dementia, paroxysmal atrial fibrillation on Xarelto, hypertension, rheumatoid arthritis, high hyperlipidemia, aortic aneurysm presented with syncope.  As per the daughter patient has dementia is confused and becomes aggressive sometimes due to worsening cognitive deficits and had frequent elopements. Prior to admission patient was found collapsed and unresponsive in the recliner.  On further evaluation she was found to be bradycardic with heart rate in 30s.  Minute On arrival to ED heart rate improved to 50s per minute, CT of the head does not show any acute intracranial abnormalities but shows chronic microvascular changes consistent with dementia. CT angiogram of the chest is negative for PE.  Blocker were stopped and the heart rate has been stable in the low 50s.  Patient is alert pleasant and is able to answer some simple questions.  Not following commands.  Assessment & Plan:   Active Problems:   Syncope and collapse   Syncope   Palliative care by specialist   Goals of care, counseling/discussion   Physical deconditioning   Alzheimer's dementia with behavioral disturbance (HCC)   Delirium   DNR (do not resuscitate)  Syncope probably secondary to bradycardia from Cardizem and beta-blocker.  Patient's heart rate still in low 50s continue to hold both beta-blocker and Cardizem for now. Echocardiogram this admission showed preserved left ventricular ejection fraction, without significant abnormalities. PT evaluation recommended SNF. No dizziness or syncope at this time.     Essential hypertension Continue with home Norvasc.  Next   Paroxysmal atrial fibrillation Heart rate in low 50s.  Beta-blocker and Cardizem on hold and continue with Xarelto  for anticoagulation.  Dementia with behavioral abnormalities Patient appears to be stable at this time without any agitation.    Hyperlipidemia Continue with statin.   Incidental finding of right upper lobe opacity on the CT suspicious for malignancy versus pneumonia. Empirically treating for pneumonia.  Discussed with the patient's daughter that it could be malignant. Recommended to follow-up with PCP outpatient repeat CT scan of the chest and further work-up with biopsy if daughter wants to prefer aggressive management. Recommend continue withIV antibiotics for another 24 hours and transition to oral antibiotics on discharge.   Currently waiting for toc eval for snf.   DVT prophylaxis: xarelto Code Status: DNR Family Communication: Disposition:   Status RS:[id number].   The patient will require care spanning > 2 midnights and should be moved to inpatient because: Unsafe d/c plan and Inpatient level of care appropriate due to severity of illness  Dispo: The patient is from: ALF              Anticipated d/c is to: SNF              Anticipated d/c date is: 1 day              Patient currently is not medically stable to d/c.       Consultants:   NONE   Procedures: echo  Antimicrobials: ROCEPHIN ans zithromax  Subjective: NO CHEST PAIN or sob.   Objective: Vitals:   05/20/20 0728 05/20/20 1554 05/20/20 2221 05/21/20 0803  BP: (!) 111/53 (!) 164/63 (!) 124/57 (!) 147/63  Pulse: (!) 58 61 61 (!) 55  Resp: 16 20 16 17   Temp: 98.1 F (  36.7 C) 98 F (36.7 C) 98 F (36.7 C) 98.2 F (36.8 C)  TempSrc:  Oral Oral   SpO2: 96% 100% 98% 94%    Intake/Output Summary (Last 24 hours) at 05/21/2020 1242 Last data filed at 05/21/2020 0600 Gross per 24 hour  Intake --  Output 300 ml  Net -300 ml   There were no vitals filed for this visit.  Examination:  General exam: Alert and comfortable not in distress. Respiratory system: Clear to auscultation bilaterally, no  wheezing or rhonchi Cardiovascular system: S1-S2 heard, bradycardic, no JVD, no pedal edema Gastrointestinal system: Abdomen is soft, nontender, nondistended bowel sounds normal Central nervous system: Alert, opens eyes to verbal cues set okay to question did not follow commands. Extremities: No cyanosis or clubbing Skin: No rash seen Psychiatry: Patient confused    Data Reviewed: I have personally reviewed following labs and imaging studies  CBC: Recent Labs  Lab 05/19/20 1255 05/20/20 1128  WBC 8.2 6.8  NEUTROABS 5.7 4.9  HGB 10.8* 10.6*  HCT 34.9* 33.6*  MCV 100.6* 95.7  PLT 269 254    Basic Metabolic Panel: Recent Labs  Lab 05/19/20 1255 05/20/20 1128  NA 139 138  K 4.4 4.1  CL 105 106  CO2 22 23  GLUCOSE 127* 113*  BUN 28* 21  CREATININE 1.14* 0.96  CALCIUM 9.0 9.0    GFR: CrCl cannot be calculated (Unknown ideal weight.).  Liver Function Tests: No results for input(s): AST, ALT, ALKPHOS, BILITOT, PROT, ALBUMIN in the last 168 hours.  CBG: Recent Labs  Lab 05/19/20 1301  GLUCAP 117*     Recent Results (from the past 240 hour(s))  SARS Coronavirus 2 by RT PCR (hospital order, performed in [facility] hospital lab) Nasopharyngeal Nasopharyngeal Swab     Status: None   Collection Time: 05/19/20 12:56 PM   Specimen: Nasopharyngeal Swab  Result Value Ref Range Status   SARS Coronavirus 2 NEGATIVE NEGATIVE Final    Comment: (NOTE) SARS-CoV-2 target nucleic acids are NOT DETECTED.  The SARS-CoV-2 RNA is generally detectable in upper and lower respiratory specimens during the acute phase of infection. The lowest concentration of SARS-CoV-2 viral copies this assay can detect is 250 copies / mL. A negative result does not preclude SARS-CoV-2 infection and should not be used as the sole basis for treatment or other patient management decisions.  A negative result may occur with improper specimen collection / handling, submission of specimen other than  nasopharyngeal swab, presence of viral mutation(s) within the areas targeted by this assay, and inadequate number of viral copies (<250 copies / mL). A negative result must be combined with clinical observations, patient history, and epidemiological information.  Fact Sheet for Patients:   BoilerBrush.com.cy  Fact Sheet for Healthcare Providers: https://pope.com/  This test is not yet approved or  cleared by the [place] FDA and has been authorized for detection and/or diagnosis of SARS-CoV-2 by FDA under an Emergency Use Authorization (EUA).  This EUA will remain in effect (meaning this test can be used) for the duration of the COVID-19 declaration under Section 564(b)(1) of the Act, 21 U.S.C. section 360bbb-3(b)(1), unless the authorization is terminated or revoked sooner.  Performed at [facility], [address] [place], [place] [address]          Radiology Studies: CT HEAD WO CONTRAST  Result Date: 05/19/2020 CLINICAL DATA:  Seizure EXAM: CT HEAD WITHOUT CONTRAST TECHNIQUE: Contiguous axial images were obtained from the base of the  skull through the vertex without intravenous contrast. COMPARISON:  CT head 11/22/2019 FINDINGS: Brain: The CT head was performed 15 minutes after the CT angio chest. There is vascular contrast due to preceding CTA. Mild to moderate atrophy. Negative for hydrocephalus. Patchy white matter hypodensity bilaterally is unchanged and compatible with chronic microvascular ischemia. Negative for acute infarct, hemorrhage, mass. No enhancing lesion identified. Vascular: Diffuse vascular enhancement due to earlier contrast injection. Skull: Negative Sinuses/Orbits: Mucosal edema and air-fluid level in the sphenoid sinus. Remaining sinuses clear. Bilateral cataract extraction Other: None IMPRESSION: No acute abnormality. Atrophy and chronic microvascular ischemic change. No new finding since the prior  CT. Electronically Signed   By: Marlan Palau M.D.   On: 05/19/2020 16:36   CT Angio Chest PE W and/or Wo Contrast  Result Date: 05/19/2020 CLINICAL DATA:  Shortness of breath, history of PE EXAM: CT ANGIOGRAPHY CHEST WITH CONTRAST TECHNIQUE: Multidetector CT imaging of the chest was performed using the standard protocol during bolus administration of intravenous contrast. Multiplanar CT image reconstructions and MIPs were obtained to evaluate the vascular anatomy. CONTRAST:  16mL OMNIPAQUE IOHEXOL 350 MG/ML SOLN COMPARISON:  2020 FINDINGS: Cardiovascular: Satisfactory opacification of the pulmonary arteries to the segmental level. No evidence of pulmonary embolism. Enlarged pulmonary arteries are unchanged. Ascending thoracic aorta again measures up to 4.5 cm. Stable heart size. No pericardial effusion. Mediastinum/Nodes: No enlarged lymph nodes. Stable appearance of thyroid. Lungs/Pleura: Left upper lobe mixed consolidative and ground-glass opacity remains present. Linear scarring or atelectasis at the lung bases. No pleural effusion or pneumothorax. Upper Abdomen: Unchanged moderate hiatal hernia. Musculoskeletal: No acute osseous abnormality. Review of the MIP images confirms the above findings. IMPRESSION: No evidence of acute pulmonary embolism. Persistent left upper lobe consolidative and ground-glass opacity raising the possibility of malignancy. Stable aneurysmal dilatation of the ascending thoracic aorta. Consider semi-annual imaging followup by CTA or MRA and referral to cardiothoracic surgery taking into account comorbidities and life expectancy. Enlarged pulmonary arteries reflecting pulmonary arterial hypertension. Electronically Signed   By: Guadlupe Spanish M.D.   On: 05/19/2020 16:27   DG Knee Complete 4 Views Right  Result Date: 05/19/2020 CLINICAL DATA:  Knee pain EXAM: RIGHT KNEE - COMPLETE 4+ VIEW COMPARISON:  10/27/2018 FINDINGS: There is no acute fracture or dislocation. Joint effusion  is present. There are advanced tricompartmental changes of osteoarthritis with severe joint space narrowing is medially. There are likely some calcified intra-articular loose bodies. Vascular calcification is noted. IMPRESSION: Advanced osteoarthritis. Probable calcified intra-articular loose bodies. Joint effusion. Electronically Signed   By: Guadlupe Spanish M.D.   On: 05/19/2020 14:08   ECHOCARDIOGRAM COMPLETE  Result Date: 05/20/2020    ECHOCARDIOGRAM REPORT   Patient Name:   Sylvia Davila Date of Exam: 05/20/2020 Medical Rec #:  [id number]      Height:       63.0 in Accession #:    [id number]     Weight:       165.0 lb Date of Birth:  1927-01-28      BSA:          1.782 m Patient Age:    89 years       BP:           111/53 mmHg Patient Gender: F              HR:           58 bpm. Exam Location:  Inpatient Procedure: 2D Echo, Cardiac Doppler and Color Doppler Indications:  Syncope  History:        Patient has no prior history of Echocardiogram examinations.                 Arrythmias:Atrial Fibrillation, Signs/Symptoms:Syncope and                 dementia; Risk Factors:Hypertension and Dyslipidemia.  Sonographer:    Lavenia Atlas Referring Phys: [id number] Kathlen Mody  Sonographer Comments: Altered mental status. IMPRESSIONS  1. Left ventricular ejection fraction, by estimation, is 60 to 65%. The left ventricle has normal function. The left ventricle has no regional wall motion abnormalities. Left ventricular diastolic parameters are indeterminate.  2. RV-RA gradient normal at 21 mmHg, unable to estimate CVP. Right ventricular systolic function is normal. The right ventricular size is normal.  3. The mitral valve is grossly normal, mild to moderate annular calcification. Trivial mitral valve regurgitation.  4. The aortic valve is tricuspid. Aortic valve regurgitation is mild. Aortic regurgitation PHT measures 848 msec. FINDINGS  Left Ventricle: Left ventricular ejection fraction, by estimation, is 60 to 65%.  The left ventricle has normal function. The left ventricle has no regional wall motion abnormalities. The left ventricular internal cavity size was normal in size. There is  borderline left ventricular hypertrophy. Left ventricular diastolic parameters are indeterminate. Right Ventricle: RV-RA gradient normal at 21 mmHg, unable to estimate CVP. The right ventricular size is normal. No increase in right ventricular wall thickness. Right ventricular systolic function is normal. Left Atrium: Left atrial size was normal in size. Right Atrium: Right atrial size was normal in size. Pericardium: There is no evidence of pericardial effusion. Mitral Valve: The mitral valve is grossly normal. Mild to moderate mitral annular calcification. Trivial mitral valve regurgitation. Tricuspid Valve: The tricuspid valve is grossly normal. Tricuspid valve regurgitation is trivial. Aortic Valve: The aortic valve is tricuspid. Aortic valve regurgitation is mild. Aortic regurgitation PHT measures 848 msec. Pulmonic Valve: The pulmonic valve was grossly normal. Pulmonic valve regurgitation is trivial. Aorta: The aortic root is normal in size and structure. Venous: Unable to estimate CVP. IAS/Shunts: The interatrial septum was not well visualized.  LEFT VENTRICLE PLAX 2D LVIDd:         4.60 cm  Diastology LVIDs:         3.00 cm  LV e' lateral:   9.25 cm/s LV PW:         1.00 cm  LV E/e' lateral: 7.0 LV IVS:        1.00 cm  LV e' medial:    4.46 cm/s LVOT diam:     2.20 cm  LV E/e' medial:  14.5 LV SV:         71 LV SV Index:   40 LVOT Area:     3.80 cm  RIGHT VENTRICLE RV Basal diam:  3.40 cm RV S prime:     16.40 cm/s TAPSE (M-mode): 3.4 cm LEFT ATRIUM             Index       RIGHT ATRIUM           Index LA diam:        3.20 cm 1.80 cm/m  RA Area:     18.40 cm LA Vol (A2C):   46.0 ml 25.82 ml/m RA Volume:   51.50 ml  28.90 ml/m LA Vol (A4C):   44.5 ml 24.97 ml/m LA Biplane Vol: 45.4 ml 25.48 ml/m  AORTIC VALVE LVOT Vmax:   88.80 cm/s  LVOT Vmean:  53.100 cm/s LVOT VTI:    0.188 m AI PHT:      848 msec  AORTA Ao Root diam: 3.60 cm MITRAL VALVE               TRICUSPID VALVE MV Area (PHT): 2.83 cm    TR Peak grad:   21.3 mmHg MV Decel Time: 268 msec    TR Vmax:        231.00 cm/s MV E velocity: 64.70 cm/s MV A velocity: 73.70 cm/s  SHUNTS MV E/A ratio:  0.88        Systemic VTI:  0.19 m                            Systemic Diam: 2.20 cm Nona Dell MD Electronically signed by Nona Dell MD Signature Date/Time: 05/20/2020/3:40:36 PM    Final         Scheduled Meds: . amLODipine  5 mg Oral q AM  . atorvastatin  20 mg Oral Daily  . azithromycin  500 mg Oral Daily  . calcium carbonate  500 mg Oral Daily  . folic acid  1 mg Oral Daily  . furosemide  20 mg Oral Daily  . LORazepam  0.5 mg Oral TID  . pantoprazole  40 mg Oral Daily  . Rivaroxaban  15 mg Oral Q supper  . sodium chloride  1 g Oral Q2000   Continuous Infusions:   LOS: 1 day        Kathlen Mody, MD [facility]   To contact the attending provider between 7A-7P or the covering provider during after hours 7P-7A, please log into the web site [email or web address] and access using universal [place] password for that web site. If you do not have the password, please call the hospital operator.  05/21/2020, 12:42 PM

## 2020-05-22 DIAGNOSIS — G309 Alzheimer's disease, unspecified: Secondary | ICD-10-CM

## 2020-05-22 DIAGNOSIS — F0281 Dementia in other diseases classified elsewhere with behavioral disturbance: Secondary | ICD-10-CM

## 2020-05-22 MED ORDER — AZITHROMYCIN 250 MG PO TABS: 500.0000 mg | ORAL_TABLET | Freq: Every day | ORAL | 0 refills | Status: AC

## 2020-05-22 NOTE — TOC Initial Note (Signed)
Transition of Care [facility]) - Initial/Assessment Note    Patient Details  Name: Sylvia Davila MRN: [id number] Date of Birth: 06-06-1927  Transition of [facility]) CM/SW Contact:    Lorri Frederick, LCSW Phone Number: 05/22/2020, 12:20 PM  Clinical Narrative:  CSW completed assessment by phone with daughter,  Reggy Eye.  CSW working remotely today, did not have FF contact with pt. Discussed discharge recommendation for SNF.  Pt has advanced dementia, is in memory care at [facility] and Judy's preference would be for her to return there.  [name] reports pt already gets OT/PT at [facility].  Pt is vaccinated.  PCP is through [facility] as well.                  Expected Discharge Plan: Assisted Living     Patient Goals and CMS Choice        Expected Discharge Plan and Services Expected Discharge Plan: Assisted Living     Post Acute Care Choice: Resumption of Svcs/PTA Provider Living arrangements for the past 2 months: Assisted Living Facility [facility] memory care)                                      Prior Living Arrangements/Services Living arrangements for the past 2 months: Assisted Living Facility [facility]) Lives with:: Facility Resident          Need for Family Participation in Patient Care: Yes (Comment) Care giver support system in place?: Yes (comment)   Criminal Activity/Legal Involvement Pertinent to Current Situation/Hospitalization: No - Comment as needed  Activities of Daily Living      Permission Sought/Granted                  Emotional Assessment   Attitude/Demeanor/Rapport: Unable to Assess Affect (typically observed): Unable to Assess Orientation: : Oriented to Self Alcohol / Substance Use: Not Applicable Psych Involvement: No (comment)  Admission diagnosis:  Syncope and collapse [R55] Syncope [R55] Patient Active Problem List   Diagnosis Date Noted  . Palliative care by specialist   . Goals of care,  counseling/discussion   . Physical deconditioning   . Alzheimer's dementia with behavioral disturbance (HCC)   . Delirium   . DNR (do not resuscitate)   . Syncope 05/20/2020  . Syncope and collapse 05/19/2020   PCP:  Patient, No Pcp Per Pharmacy:  No Pharmacies Listed    Social Determinants of Health (SDOH) Interventions    Readmission Risk Interventions No flowsheet data found.

## 2020-05-22 NOTE — Progress Notes (Signed)
Attempted to call report to SNF- RN took my number and will call back for report.

## 2020-05-22 NOTE — Evaluation (Signed)
Occupational Therapy Evaluation Patient Details Name: Sylvia Davila MRN: [id number] DOB: [birth year] Today's Date: 05/22/2020    History of Present Illness Sylvia Davila is a 86 y.o. female with medical history significant of advanced dementia, paroxysmal A. fib on Xarelto, hypertension, aortic aneurysm, rheumatoid arthritis, HTN, HLD, presented with syncope   Clinical Impression   PTA, pt resides at an ALF, is ambulatory and receives assistance with most ADLs. Today, pt with flat affect and crying intermittently, unable to be redirected despite multiple avenues of tactics. Attempted to call pt's daughter for PLOF and to assist in reorienting pt though no answer received. Based on cognition during session, unable to engage pt in OOB activities. Recommend SNF for ST rehab to maximize safety and independence with daily tasks. Plan to further progress safety with ADLs and mobility as appropriate.     Follow Up Recommendations  SNF;Supervision/Assistance - 24 hour    Equipment Recommendations  Other (comment) (TBD)    Recommendations for Other Services       Precautions / Restrictions Precautions Precautions: Fall Precaution Comments: orthostatic hypotension, confusion Restrictions Weight Bearing Restrictions: No      Mobility Bed Mobility Overal bed mobility: Needs Assistance Bed Mobility: Sidelying to Sit   Sidelying to sit: Min guard       General bed mobility comments: min guard for bed mob  Transfers                 General transfer comment: pt declined    Balance                                           ADL either performed or assessed with clinical judgement   ADL Overall ADL's : Needs assistance/impaired Eating/Feeding: Supervision/ safety;Sitting   Grooming: Supervision/safety;Sitting;Wash/dry face Grooming Details (indicate cue type and reason): Supervision to wash face side lying in bed Upper Body Bathing: Sitting;Minimal  assistance   Lower Body Bathing: Moderate assistance;Sit to/from stand   Upper Body Dressing : Minimal assistance;Sitting   Lower Body Dressing: Moderate assistance;Sit to/from stand       Toileting- Architect and Hygiene: Moderate assistance;Sit to/from stand         General ADL Comments: Pt with generalized weakness and hx of orthostatic hypotension impacting ability to complete daily tasks. Pt primarily limited by cognition today     Vision Baseline Vision/History: No visual deficits Patient Visual Report: No change from baseline Vision Assessment?: No apparent visual deficits     Perception Perception Perception Tested?: No   Praxis      Pertinent Vitals/Pain Pain Assessment: Faces Faces Pain Scale: No hurt     Hand Dominance Right   Extremity/Trunk Assessment Upper Extremity Assessment Upper Extremity Assessment: Generalized weakness   Lower Extremity Assessment Lower Extremity Assessment: Defer to PT evaluation   Cervical / Trunk Assessment Cervical / Trunk Assessment: Kyphotic   Communication Communication Communication: No difficulties   Cognition Arousal/Alertness: Awake/alert Behavior During Therapy: Flat affect Overall Cognitive Status: Impaired/Different from baseline                                 General Comments: Pt with flat affect, began crying at times reporting "nothing matters" when OT attempted to assist in changing gown that had foot particles on it. A&Ox1 - reoriented as appropriate though pt reporting "  I think she is dead". Attempted to call daughter, Sylvia Davila, to assist in calming pt but unable to reach   General Comments  Assessed BP lying with 120/56, attempted to engage pt in further transitional BP assessment with pt crying, perseverating on how things dont matter. Unable to redirect    Exercises     Shoulder Instructions      Home Living Family/patient expects to be discharged to:: Assisted living                              Home Equipment: Dan Humphreys - 2 wheels   Additional Comments: From [facility]       Prior Functioning/Environment Level of Independence: Needs assistance  Gait / Transfers Assistance Needed: per chart, pt ambulatory, "paces" the halls at ALF ADL's / Homemaking Assistance Needed: Per chart, pt able to feed self but requires assistance with all other ADLs   Comments: dementia, pt unable to recall hx         OT Problem List: Decreased strength;Decreased activity tolerance;Impaired balance (sitting and/or standing);Decreased cognition;Decreased knowledge of use of DME or AE      OT Treatment/Interventions: Self-care/ADL training;Therapeutic exercise;Energy conservation;DME and/or AE instruction;Therapeutic activities;Patient/family education    OT Goals(Current goals can be found in the care plan section) Acute Rehab OT Goals Patient Stated Goal: not defined OT Goal Formulation: With patient Time For Goal Achievement: 06/05/20 Potential to Achieve Goals: Fair ADL Goals Pt Will Perform Grooming: with set-up;standing Pt Will Transfer to Toilet: with supervision;ambulating;bedside commode Pt Will Perform Toileting - Clothing Manipulation and hygiene: with supervision;sit to/from stand;sitting/lateral leans Additional ADL Goal #1: Pt will demonstrate ability to follow two-step commands >75% of the time to maximize safety and participation with ADLs  OT Frequency: Min 2X/week   Barriers to D/C:            Co-evaluation              AM-PAC OT "6 Clicks" Daily Activity     Outcome Measure Help from another person eating meals?: A Little Help from another person taking care of personal grooming?: A Little Help from another person toileting, which includes using toliet, bedpan, or urinal?: A Lot Help from another person bathing (including washing, rinsing, drying)?: A Lot Help from another person to put on and taking off regular upper body  clothing?: A Little Help from another person to put on and taking off regular lower body clothing?: A Lot 6 Click Score: 15   End of Session Nurse Communication: Mobility status;Other (comment) (behaviors)  Activity Tolerance: Other (comment) (limited by cognition) Patient left: in bed;with call bell/phone within reach;with bed alarm set  OT Visit Diagnosis: Unsteadiness on feet (R26.81);Other abnormalities of gait and mobility (R26.89);Muscle weakness (generalized) (M62.81);Other symptoms and signs involving cognitive function                Time: 1125-1147 OT Time Calculation (min): 22 min Charges:  OT General Charges $OT Visit: 1 Visit OT Evaluation $OT Eval Moderate Complexity: 1 Mod  Lorre Munroe, OTR/L  Lorre Munroe 05/22/2020, 12:06 PM

## 2020-05-22 NOTE — [place] FL2 (Signed)
[place] MEDICAID FL2 LEVEL OF CARE SCREENING TOOL     IDENTIFICATION  Patient Name: Sylvia Davila Birthdate: 24-May-1927 Sex: female Admission Date (Current Location): 05/19/2020  [facility] and [name] Number:  Producer, television/film/video and Address:  [facility], [address] [place], [place] [address]      Provider Number: [id number]  Attending Physician Name and Address:  Kathlen Mody, MD  Relative Name and Phone Number:  Reggy Eye, [phone]    Current Level of Care: Hospital Recommended Level of Care: Assisted Living Facility Prior Approval Number:    Date Approved/Denied:   PASRR Number:    Discharge Plan: Other (Comment) [facility] memory care unit)    Current Diagnoses: Patient Active Problem List   Diagnosis Date Noted  . Palliative care by specialist   . Goals of care, counseling/discussion   . Physical deconditioning   . Alzheimer's dementia with behavioral disturbance (HCC)   . Delirium   . DNR (do not resuscitate)   . Syncope 05/20/2020  . Syncope and collapse 05/19/2020    Orientation RESPIRATION BLADDER Height & Weight     Self  Normal Incontinent Weight:   Height:     BEHAVIORAL SYMPTOMS/MOOD NEUROLOGICAL BOWEL NUTRITION STATUS  Wanderer   Continent Diet (Heart diet.  See discharge summary)  AMBULATORY STATUS COMMUNICATION OF NEEDS Skin   Limited Assist Verbally Normal                       Personal Care Assistance Level of Assistance  Bathing, Feeding, Dressing Bathing Assistance: Limited assistance Feeding assistance: Independent Dressing Assistance: Limited assistance     Functional Limitations Info  Sight, Hearing, Speech Sight Info: Adequate Hearing Info: Adequate Speech Info: Adequate    SPECIAL CARE FACTORS FREQUENCY  PT (By licensed PT), OT (By licensed OT)     PT Frequency: 3x week OT Frequency: 3x week            Contractures Contractures Info: Not present    Additional  Factors Info  Code Status, Allergies Code Status Info: DNR Allergies Info: Meclizine, Penicillins           Current Medications (05/22/2020):  This is the current hospital active medication list Current Facility-Administered Medications  Medication Dose Route Frequency Provider Last Rate Last Admin  . acetaminophen (TYLENOL) tablet 650 mg  650 mg Oral Q8H PRN Emeline General, MD   650 mg at 05/21/20 0843  . amLODipine (NORVASC) tablet 5 mg  5 mg Oral q AM Emeline General, MD   5 mg at 05/22/20 0709  . atorvastatin (LIPITOR) tablet 20 mg  20 mg Oral Daily Mikey College T, MD   20 mg at 05/22/20 5621  . azithromycin (ZITHROMAX) tablet 500 mg  500 mg Oral Daily Calton Dach I, RPH   500 mg at 05/22/20 3086  . calcium carbonate (TUMS - dosed in mg elemental calcium) chewable tablet 500 mg  500 mg Oral Daily Mikey College T, MD   500 mg at 05/22/20 0919  . diclofenac Sodium (VOLTAREN) 1 % topical gel 2 g  2 g Topical QID PRN Emeline General, MD   2 g at 05/22/20 [phone]  . docusate sodium (COLACE) capsule 50 mg  50 mg Oral Q12H PRN Mikey College T, MD      . famotidine (PEPCID) tablet 10 mg  10 mg Oral Daily PRN Emeline General, MD      . folic acid (  FOLVITE) tablet 1 mg  1 mg Oral Daily Kathlen Mody, MD   1 mg at 05/22/20 0917  . furosemide (LASIX) tablet 20 mg  20 mg Oral Daily Mikey College T, MD   20 mg at 05/22/20 [phone]  . LORazepam (ATIVAN) tablet 0.5 mg  0.5 mg Oral TID Kathlen Mody, MD   0.5 mg at 05/22/20 0916  . pantoprazole (PROTONIX) EC tablet 40 mg  40 mg Oral Daily Mikey College T, MD   40 mg at 05/22/20 0916  . polyethylene glycol (MIRALAX / GLYCOLAX) packet 17 g  17 g Oral Daily PRN Mikey College T, MD      . prochlorperazine (COMPAZINE) tablet 10 mg  10 mg Oral Q8H PRN Mikey College T, MD      . Rivaroxaban Carlena Hurl) tablet 15 mg  15 mg Oral Q supper Mikey College T, MD   15 mg at 05/21/20 1623  . sertraline (ZOLOFT) tablet 75 mg  75 mg Oral Daily Kathlen Mody, MD   75 mg at 05/22/20 0916  .  sodium chloride tablet 1 g  1 g Oral Q2000 Emeline General, MD   1 g at 05/22/20 9622     Discharge Medications: Please see discharge summary for a list of discharge medications.  Relevant Imaging Results:  Relevant Lab Results:   Additional Information SSN: [id number]  Lorri Frederick, LCSW

## 2020-05-22 NOTE — Progress Notes (Addendum)
PROGRESS NOTE    Sylvia Davila  WUJ:[id number] DOB: [birth year] DOA: 05/19/2020 PCP: Patient, No Pcp Per    Chief Complaint  Patient presents with  . Loss of Consciousness    Brief Narrative:  86 year old lady with prior history of dementia, paroxysmal atrial fibrillation on Xarelto, hypertension, rheumatoid arthritis, high hyperlipidemia, aortic aneurysm presented with syncope.  As per the daughter patient has dementia is confused and becomes aggressive sometimes due to worsening cognitive deficits and had frequent elopements. Prior to admission patient was found collapsed and unresponsive in the recliner.  On further evaluation she was found to be bradycardic with heart rate in 30s.  Minute On arrival to ED heart rate improved to 50s per minute, CT of the head does not show any acute intracranial abnormalities but shows chronic microvascular changes consistent with dementia. CT angiogram of the chest is negative for PE.  Blocker were stopped and the heart rate has been stable in the low 50s.  Patient seen and examined at bedside. No new complaints.   Assessment & Plan:   Active Problems:   Syncope and collapse   Syncope   Palliative care by specialist   Goals of care, counseling/discussion   Physical deconditioning   Alzheimer's dementia with behavioral disturbance (HCC)   Delirium   DNR (do not resuscitate)  Syncope probably secondary to bradycardia from Cardizem and beta-blocker. Heart rate in 60s normal sinus rhythm Echocardiogram this admission showed preserved left ventricular ejection fraction, without significant abnormalities. PT evaluation recommended SNF. No dizziness or syncope at this time.     Essential hypertension Well-controlled continue with Norvasc   Paroxysmal atrial fibrillation Patient is currently normal sinus rhythm rate controlled heart rate is in 60s.  Beta-blocker and Cardizem are on hold continue with Xarelto for anticoagulation.  Dementia  with behavioral abnormalities Patient appears to be stable at this time without any agitation.  Anemia of chronic disease.  Hemoglobin stable around 10   Hyperlipidemia Continue with statin.   Incidental finding of right upper lobe opacity on the CT suspicious for malignancy versus pneumonia. Empirically treating for pneumonia.  Discussed with the patient's daughter that it could be malignant. Recommended to follow-up with PCP outpatient repeat CT scan of the chest and further work-up with biopsy if daughter wants to prefer aggressive management. Transition to oral antibiotics on discharge.    DVT prophylaxis: xarelto Code Status: DNR Family Communication: Disposition:   Status NF:[id number].   The patient will require care spanning > 2 midnights and should be moved to inpatient because: Unsafe d/c plan, IV treatments appropriate due to intensity of illness or inability to take PO and Inpatient level of care appropriate due to severity of illness  Dispo: The patient is from: ALF              Anticipated d/c is to: SNF              Anticipated d/c date is: 1 day              Patient currently is medically stable to d/c.       Consultants:   NONE   Procedures: echo  Antimicrobials: ROCEPHIN ans zithromax  Subjective: No new complaints at this time. Objective: Vitals:   05/21/20 1614 05/21/20 2131 05/22/20 0708 05/22/20 0823  BP: (!) 110/57 (!) 106/58 (!) 118/58 126/65  Pulse: 72 (!) 59 77 68  Resp: Temp: 98.4 F (36.9 C) 98.2 F (36.8 C) 98.7 F (37.1  C) 98.4 F (36.9 C)  TempSrc:  Oral Oral Oral  SpO2: 96% 96% 98% 97%    Intake/Output Summary (Last 24 hours) at 05/22/2020 1421 Last data filed at 05/22/2020 0800 Gross per 24 hour  Intake 300 ml  Output 700 ml  Net -400 ml   There were no vitals filed for this visit.  Examination:  General exam: Alert and comfortable not in distress Respiratory system: Clear to auscultation bilaterally,  no wheezing or rhonchi Cardiovascular system: S1-S2 heard, regular rate rhythm, no JVD  gastrointestinal system: Abdomen is soft, nontender, nondistended, bowel sounds normal Central nervous system: Alert and confused Extremities: No cyanosis or clubbing Skin: No rashes seen Psychiatry: Patient confused    Data Reviewed: I have personally reviewed following labs and imaging studies  CBC: Recent Labs  Lab 05/19/20 1255 05/20/20 1128  WBC 8.2 6.8  NEUTROABS 5.7 4.9  HGB 10.8* 10.6*  HCT 34.9* 33.6*  MCV 100.6* 95.7  PLT 269 254    Basic Metabolic Panel: Recent Labs  Lab 05/19/20 1255 05/20/20 1128  NA 139 138  K 4.4 4.1  CL 105 106  CO2 22 23  GLUCOSE 127* 113*  BUN 28* 21  CREATININE 1.14* 0.96  CALCIUM 9.0 9.0    GFR: CrCl cannot be calculated (Unknown ideal weight.).  Liver Function Tests: No results for input(s): AST, ALT, ALKPHOS, BILITOT, PROT, ALBUMIN in the last 168 hours.  CBG: Recent Labs  Lab 05/19/20 1301  GLUCAP 117*     Recent Results (from the past 240 hour(s))  SARS Coronavirus 2 by RT PCR (hospital order, performed in [facility] hospital lab) Nasopharyngeal Nasopharyngeal Swab     Status: None   Collection Time: 05/19/20 12:56 PM   Specimen: Nasopharyngeal Swab  Result Value Ref Range Status   SARS Coronavirus 2 NEGATIVE NEGATIVE Final    Comment: (NOTE) SARS-CoV-2 target nucleic acids are NOT DETECTED.  The SARS-CoV-2 RNA is generally detectable in upper and lower respiratory specimens during the acute phase of infection. The lowest concentration of SARS-CoV-2 viral copies this assay can detect is 250 copies / mL. A negative result does not preclude SARS-CoV-2 infection and should not be used as the sole basis for treatment or other patient management decisions.  A negative result may occur with improper specimen collection / handling, submission of specimen other than nasopharyngeal swab, presence of viral mutation(s) within  the areas targeted by this assay, and inadequate number of viral copies (<250 copies / mL). A negative result must be combined with clinical observations, patient history, and epidemiological information.  Fact Sheet for Patients:   BoilerBrush.com.cy  Fact Sheet for Healthcare Providers: https://pope.com/  This test is not yet approved or  cleared by the [place] FDA and has been authorized for detection and/or diagnosis of SARS-CoV-2 by FDA under an Emergency Use Authorization (EUA).  This EUA will remain in effect (meaning this test can be used) for the duration of the COVID-19 declaration under Section 564(b)(1) of the Act, 21 U.S.C. section 360bbb-3(b)(1), unless the authorization is terminated or revoked sooner.  Performed at [facility], [address] [place], [place] [address]          Radiology Studies: ECHOCARDIOGRAM COMPLETE  Result Date: 05/20/2020    ECHOCARDIOGRAM REPORT   Patient Name:   CHAVON DUSH Date of Exam: 05/20/2020 Medical Rec #:  [id number]      Height:       63.0 in Accession #:  [id number]     Weight:       165.0 lb Date of Birth:  Mar 27, 1927      BSA:          1.782 m Patient Age:    89 years       BP:           111/53 mmHg Patient Gender: F              HR:           58 bpm. Exam Location:  Inpatient Procedure: 2D Echo, Cardiac Doppler and Color Doppler Indications:    Syncope  History:        Patient has no prior history of Echocardiogram examinations.                 Arrythmias:Atrial Fibrillation, Signs/Symptoms:Syncope and                 dementia; Risk Factors:Hypertension and Dyslipidemia.  Sonographer:    Lavenia Atlas Referring Phys: [id number] Kathlen Mody  Sonographer Comments: Altered mental status. IMPRESSIONS  1. Left ventricular ejection fraction, by estimation, is 60 to 65%. The left ventricle has normal function. The left ventricle has no regional wall motion abnormalities. Left  ventricular diastolic parameters are indeterminate.  2. RV-RA gradient normal at 21 mmHg, unable to estimate CVP. Right ventricular systolic function is normal. The right ventricular size is normal.  3. The mitral valve is grossly normal, mild to moderate annular calcification. Trivial mitral valve regurgitation.  4. The aortic valve is tricuspid. Aortic valve regurgitation is mild. Aortic regurgitation PHT measures 848 msec. FINDINGS  Left Ventricle: Left ventricular ejection fraction, by estimation, is 60 to 65%. The left ventricle has normal function. The left ventricle has no regional wall motion abnormalities. The left ventricular internal cavity size was normal in size. There is  borderline left ventricular hypertrophy. Left ventricular diastolic parameters are indeterminate. Right Ventricle: RV-RA gradient normal at 21 mmHg, unable to estimate CVP. The right ventricular size is normal. No increase in right ventricular wall thickness. Right ventricular systolic function is normal. Left Atrium: Left atrial size was normal in size. Right Atrium: Right atrial size was normal in size. Pericardium: There is no evidence of pericardial effusion. Mitral Valve: The mitral valve is grossly normal. Mild to moderate mitral annular calcification. Trivial mitral valve regurgitation. Tricuspid Valve: The tricuspid valve is grossly normal. Tricuspid valve regurgitation is trivial. Aortic Valve: The aortic valve is tricuspid. Aortic valve regurgitation is mild. Aortic regurgitation PHT measures 848 msec. Pulmonic Valve: The pulmonic valve was grossly normal. Pulmonic valve regurgitation is trivial. Aorta: The aortic root is normal in size and structure. Venous: Unable to estimate CVP. IAS/Shunts: The interatrial septum was not well visualized.  LEFT VENTRICLE PLAX 2D LVIDd:         4.60 cm  Diastology LVIDs:         3.00 cm  LV e' lateral:   9.25 cm/s LV PW:         1.00 cm  LV E/e' lateral: 7.0 LV IVS:        1.00 cm  LV e'  medial:    4.46 cm/s LVOT diam:     2.20 cm  LV E/e' medial:  14.5 LV SV:         71 LV SV Index:   40 LVOT Area:     3.80 cm  RIGHT VENTRICLE RV Basal diam:  3.40 cm RV S prime:  16.40 cm/s TAPSE (M-mode): 3.4 cm LEFT ATRIUM             Index       RIGHT ATRIUM           Index LA diam:        3.20 cm 1.80 cm/m  RA Area:     18.40 cm LA Vol (A2C):   46.0 ml 25.82 ml/m RA Volume:   51.50 ml  28.90 ml/m LA Vol (A4C):   44.5 ml 24.97 ml/m LA Biplane Vol: 45.4 ml 25.48 ml/m  AORTIC VALVE LVOT Vmax:   88.80 cm/s LVOT Vmean:  53.100 cm/s LVOT VTI:    0.188 m AI PHT:      848 msec  AORTA Ao Root diam: 3.60 cm MITRAL VALVE               TRICUSPID VALVE MV Area (PHT): 2.83 cm    TR Peak grad:   21.3 mmHg MV Decel Time: 268 msec    TR Vmax:        231.00 cm/s MV E velocity: 64.70 cm/s MV A velocity: 73.70 cm/s  SHUNTS MV E/A ratio:  0.88        Systemic VTI:  0.19 m                            Systemic Diam: 2.20 cm Nona Dell MD Electronically signed by Nona Dell MD Signature Date/Time: 05/20/2020/3:40:36 PM    Final         Scheduled Meds: . amLODipine  5 mg Oral q AM  . atorvastatin  20 mg Oral Daily  . azithromycin  500 mg Oral Daily  . calcium carbonate  500 mg Oral Daily  . folic acid  1 mg Oral Daily  . furosemide  20 mg Oral Daily  . LORazepam  0.5 mg Oral TID  . pantoprazole  40 mg Oral Daily  . Rivaroxaban  15 mg Oral Q supper  . sertraline  75 mg Oral Daily  . sodium chloride  1 g Oral Q2000   Continuous Infusions:   LOS: 2 days        Kathlen Mody, MD [facility]   To contact the attending provider between 7A-7P or the covering provider during after hours 7P-7A, please log into the web site [email or web address] and access using universal [place] password for that web site. If you do not have the password, please call the hospital operator.  05/22/2020, 2:21 PM

## 2020-05-22 NOTE — Discharge Summary (Signed)
Physician Discharge Summary  Sylvia Davila VEL:[id number] DOB: Jul 13, 1927 DOA: 05/19/2020  PCP: Patient, No Pcp Per  Admit date: 05/19/2020 Discharge date: 05/22/2020  Admitted From: ALF Disposition:  ALF  Recommendations for Outpatient Follow-up:  1. Follow up with PCP in 1-2 weeks 2. Please obtain BMP/CBC in one week Please follow up with cardiology in one week as we have stopped both the bb  and cardizem .  Please follow u pwith PCP regarding abnormal CT scan , probably need re imaging.  Recommend outpatient follow up with palliative care.   Discharge Condition:STABLE.  CODE STATUS:dnr Diet recommendation: Heart Healthy Brief/Interim Summary: 86 year old lady with prior history of dementia, paroxysmal atrial fibrillation on Xarelto, hypertension, rheumatoid arthritis, high hyperlipidemia, aortic aneurysm presented with syncope.  As per the daughter patient has dementia is confused and becomes aggressive sometimes due to worsening cognitive deficits and had frequent elopements. Prior to admission patient was found collapsed and unresponsive in the recliner.  On further evaluation she was found to be bradycardic with heart rate in 30s.  Minute On arrival to ED heart rate improved to 50s per minute, CT of the head does not show any acute intracranial abnormalities but shows chronic microvascular changes consistent with dementia. CT angiogram of the chest is negative for PE.    cardizem and beta Blocker were stopped and the heart rate has been stable in the low 60's   Discharge Diagnoses:  Active Problems:   Syncope and collapse   Syncope   Palliative care by specialist   Goals of care, counseling/discussion   Physical deconditioning   Alzheimer's dementia with behavioral disturbance (HCC)   Delirium   DNR (do not resuscitate)  Syncope probably secondary to bradycardia from Cardizem and beta-blocker. Heart rate in 60s normal sinus rhythm Echocardiogram this admission showed  preserved left ventricular ejection fraction, without significant abnormalities. PT evaluation recommended SNF. No dizziness or syncope at this time.     Essential hypertension Well-controlled continue with Norvasc   Paroxysmal atrial fibrillation Patient is currently normal sinus rhythm rate controlled heart rate is in 60s.  Beta-blocker and Cardizem are on hold continue with Xarelto for anticoagulation.  Dementia with behavioral abnormalities Patient appears to be stable at this time without any agitation.  Anemia of chronic disease.  Hemoglobin stable around 10   Hyperlipidemia Continue with statin.   Incidental finding of right upper lobe opacity on the CT suspicious for malignancy versus pneumonia. Empirically treating for pneumonia.  Discussed with the patient's daughter that it could be malignant. Recommended to follow-up with PCP outpatient repeat CT scan of the chest and further work-up with biopsy if daughter wants to prefer aggressive management. Transition to oral antibiotics on discharge.     Discharge Instructions  Discharge Instructions    Diet - low sodium heart healthy   Complete by: As directed    Discharge instructions   Complete by: As directed    Please follow up with cardiology in one week as we have stopped both the bb  and cardizem .  Please follow u pwith PCP regarding abnormal CT scan , probably need re imaging.   Increase activity slowly   Complete by: As directed      Allergies as of 05/22/2020      Reactions   Meclizine Rash   Penicillins Rash      Medication List    STOP taking these medications   atenolol 50 MG tablet Commonly known as: TENORMIN   diltiazem 120 MG 24 hr  capsule Commonly known as: TIAZAC     TAKE these medications   acetaminophen 325 MG tablet Commonly known as: TYLENOL Take 650 mg by mouth every 8 (eight) hours as needed (pain). What changed: Another medication with the same name was removed.  Continue taking this medication, and follow the directions you see here.   alendronate 70 MG tablet Commonly known as: FOSAMAX Take 70 mg by mouth every Monday.   amLODipine 5 MG tablet Commonly known as: NORVASC Take 5 mg by mouth daily.   atorvastatin 20 MG tablet Commonly known as: LIPITOR Take 20 mg by mouth daily.   azithromycin 250 MG tablet Commonly known as: ZITHROMAX Take 2 tablets (500 mg total) by mouth daily for 2 days. Start taking on: May 23, 2020   Calcium Carbonate 500 MG Chew Chew 500 mg by mouth daily. Tablet   Colace Clear 50 MG capsule Generic drug: docusate sodium Take 50 mg by mouth every 12 (twelve) hours as needed for mild constipation.   diclofenac Sodium 1 % Gel Commonly known as: VOLTAREN Apply 1 application topically 3 (three) times daily.   famotidine-calcium carbonate-magnesium hydroxide 10-800-165 MG chewable tablet Commonly known as: PEPCID COMPLETE Chew 1 tablet by mouth every evening.   FOLIC ACID PO Take 1 tablet by mouth daily.   furosemide 20 MG tablet Commonly known as: LASIX Take 20 mg by mouth daily.   Lecithin 1200 MG Caps Take 1,200 mg by mouth daily.   LORazepam 0.5 MG tablet Commonly known as: ATIVAN Take 0.5 mg by mouth 3 (three) times daily.   magnesium hydroxide 400 MG/5ML suspension Commonly known as: MILK OF MAGNESIA Take 15 mLs by mouth daily as needed (constipation).   methotrexate 2.5 MG tablet Commonly known as: RHEUMATREX Take 10 mg by mouth every Friday. Caution:Chemotherapy. Protect from light.   nystatin powder Commonly known as: MYCOSTATIN/NYSTOP Apply 1 application topically 2 (two) times daily. Apply under both breasts   Omega-3 1000 MG Caps Take 2,000 mg by mouth daily.   ondansetron 4 MG tablet Commonly known as: ZOFRAN Take 4 mg by mouth every 8 (eight) hours as needed for nausea or vomiting.   pantoprazole 20 MG tablet Commonly known as: PROTONIX Take 20 mg by mouth daily.    polyethylene glycol powder 17 GM/SCOOP powder Commonly known as: GLYCOLAX/MIRALAX Take 17 g by mouth See admin instructions. Mix 17 g in 4-8 oz liquid and drink daily, may also take 17 g daily as needed for constipation   potassium chloride 10 MEQ tablet Commonly known as: KLOR-CON Take 10 mEq by mouth daily.   prochlorperazine 10 MG tablet Commonly known as: COMPAZINE Take 10 mg by mouth every 8 (eight) hours as needed.   PSYLLIUM PO Take 1 packet by mouth daily. Mix in 4-8 oz liquid and drink   rivaroxaban 20 MG Tabs tablet Commonly known as: XARELTO Take 20 mg by mouth every evening.   sertraline 25 MG tablet Commonly known as: ZOLOFT Take 75 mg by mouth daily.   sodium chloride 1 g tablet Take 1 g by mouth at bedtime.       Allergies  Allergen Reactions  . Meclizine Rash  . Penicillins Rash    Consultations:  pallaitive care     Procedures/Studies: CT HEAD WO CONTRAST  Result Date: 05/19/2020 CLINICAL DATA:  Seizure EXAM: CT HEAD WITHOUT CONTRAST TECHNIQUE: Contiguous axial images were obtained from the base of the skull through the vertex without intravenous contrast. COMPARISON:  CT head 11/22/2019  FINDINGS: Brain: The CT head was performed 15 minutes after the CT angio chest. There is vascular contrast due to preceding CTA. Mild to moderate atrophy. Negative for hydrocephalus. Patchy white matter hypodensity bilaterally is unchanged and compatible with chronic microvascular ischemia. Negative for acute infarct, hemorrhage, mass. No enhancing lesion identified. Vascular: Diffuse vascular enhancement due to earlier contrast injection. Skull: Negative Sinuses/Orbits: Mucosal edema and air-fluid level in the sphenoid sinus. Remaining sinuses clear. Bilateral cataract extraction Other: None IMPRESSION: No acute abnormality. Atrophy and chronic microvascular ischemic change. No new finding since the prior CT. Electronically Signed   By: Marlan Palau M.D.   On:  05/19/2020 16:36   CT Angio Chest PE W and/or Wo Contrast  Result Date: 05/19/2020 CLINICAL DATA:  Shortness of breath, history of PE EXAM: CT ANGIOGRAPHY CHEST WITH CONTRAST TECHNIQUE: Multidetector CT imaging of the chest was performed using the standard protocol during bolus administration of intravenous contrast. Multiplanar CT image reconstructions and MIPs were obtained to evaluate the vascular anatomy. CONTRAST:  51mL OMNIPAQUE IOHEXOL 350 MG/ML SOLN COMPARISON:  2020 FINDINGS: Cardiovascular: Satisfactory opacification of the pulmonary arteries to the segmental level. No evidence of pulmonary embolism. Enlarged pulmonary arteries are unchanged. Ascending thoracic aorta again measures up to 4.5 cm. Stable heart size. No pericardial effusion. Mediastinum/Nodes: No enlarged lymph nodes. Stable appearance of thyroid. Lungs/Pleura: Left upper lobe mixed consolidative and ground-glass opacity remains present. Linear scarring or atelectasis at the lung bases. No pleural effusion or pneumothorax. Upper Abdomen: Unchanged moderate hiatal hernia. Musculoskeletal: No acute osseous abnormality. Review of the MIP images confirms the above findings. IMPRESSION: No evidence of acute pulmonary embolism. Persistent left upper lobe consolidative and ground-glass opacity raising the possibility of malignancy. Stable aneurysmal dilatation of the ascending thoracic aorta. Consider semi-annual imaging followup by CTA or MRA and referral to cardiothoracic surgery taking into account comorbidities and life expectancy. Enlarged pulmonary arteries reflecting pulmonary arterial hypertension. Electronically Signed   By: Guadlupe Spanish M.D.   On: 05/19/2020 16:27   DG Knee Complete 4 Views Right  Result Date: 05/19/2020 CLINICAL DATA:  Knee pain EXAM: RIGHT KNEE - COMPLETE 4+ VIEW COMPARISON:  10/27/2018 FINDINGS: There is no acute fracture or dislocation. Joint effusion is present. There are advanced tricompartmental changes of  osteoarthritis with severe joint space narrowing is medially. There are likely some calcified intra-articular loose bodies. Vascular calcification is noted. IMPRESSION: Advanced osteoarthritis. Probable calcified intra-articular loose bodies. Joint effusion. Electronically Signed   By: Guadlupe Spanish M.D.   On: 05/19/2020 14:08   ECHOCARDIOGRAM COMPLETE  Result Date: 05/20/2020    ECHOCARDIOGRAM REPORT   Patient Name:   KLOEY CAZAREZ Date of Exam: 05/20/2020 Medical Rec #:  [id number]      Height:       63.0 in Accession #:    [id number]     Weight:       165.0 lb Date of Birth:  05-14-1927      BSA:          1.782 m Patient Age:    86 years       BP:           111/53 mmHg Patient Gender: F              HR:           58 bpm. Exam Location:  Inpatient Procedure: 2D Echo, Cardiac Doppler and Color Doppler Indications:    Syncope  History:  Patient has no prior history of Echocardiogram examinations.                 Arrythmias:Atrial Fibrillation, Signs/Symptoms:Syncope and                 dementia; Risk Factors:Hypertension and Dyslipidemia.  Sonographer:    Lavenia Atlas Referring Phys: [id number] Kathlen Mody  Sonographer Comments: Altered mental status. IMPRESSIONS  1. Left ventricular ejection fraction, by estimation, is 60 to 65%. The left ventricle has normal function. The left ventricle has no regional wall motion abnormalities. Left ventricular diastolic parameters are indeterminate.  2. RV-RA gradient normal at 21 mmHg, unable to estimate CVP. Right ventricular systolic function is normal. The right ventricular size is normal.  3. The mitral valve is grossly normal, mild to moderate annular calcification. Trivial mitral valve regurgitation.  4. The aortic valve is tricuspid. Aortic valve regurgitation is mild. Aortic regurgitation PHT measures 848 msec. FINDINGS  Left Ventricle: Left ventricular ejection fraction, by estimation, is 60 to 65%. The left ventricle has normal function. The left ventricle  has no regional wall motion abnormalities. The left ventricular internal cavity size was normal in size. There is  borderline left ventricular hypertrophy. Left ventricular diastolic parameters are indeterminate. Right Ventricle: RV-RA gradient normal at 21 mmHg, unable to estimate CVP. The right ventricular size is normal. No increase in right ventricular wall thickness. Right ventricular systolic function is normal. Left Atrium: Left atrial size was normal in size. Right Atrium: Right atrial size was normal in size. Pericardium: There is no evidence of pericardial effusion. Mitral Valve: The mitral valve is grossly normal. Mild to moderate mitral annular calcification. Trivial mitral valve regurgitation. Tricuspid Valve: The tricuspid valve is grossly normal. Tricuspid valve regurgitation is trivial. Aortic Valve: The aortic valve is tricuspid. Aortic valve regurgitation is mild. Aortic regurgitation PHT measures 848 msec. Pulmonic Valve: The pulmonic valve was grossly normal. Pulmonic valve regurgitation is trivial. Aorta: The aortic root is normal in size and structure. Venous: Unable to estimate CVP. IAS/Shunts: The interatrial septum was not well visualized.  LEFT VENTRICLE PLAX 2D LVIDd:         4.60 cm  Diastology LVIDs:         3.00 cm  LV e' lateral:   9.25 cm/s LV PW:         1.00 cm  LV E/e' lateral: 7.0 LV IVS:        1.00 cm  LV e' medial:    4.46 cm/s LVOT diam:     2.20 cm  LV E/e' medial:  14.5 LV SV:         71 LV SV Index:   40 LVOT Area:     3.80 cm  RIGHT VENTRICLE RV Basal diam:  3.40 cm RV S prime:     16.40 cm/s TAPSE (M-mode): 3.4 cm LEFT ATRIUM             Index       RIGHT ATRIUM           Index LA diam:        3.20 cm 1.80 cm/m  RA Area:     18.40 cm LA Vol (A2C):   46.0 ml 25.82 ml/m RA Volume:   51.50 ml  28.90 ml/m LA Vol (A4C):   44.5 ml 24.97 ml/m LA Biplane Vol: 45.4 ml 25.48 ml/m  AORTIC VALVE LVOT Vmax:   88.80 cm/s LVOT Vmean:  53.100 cm/s LVOT VTI:    0.188  m AI PHT:       848 msec  AORTA Ao Root diam: 3.60 cm MITRAL VALVE               TRICUSPID VALVE MV Area (PHT): 2.83 cm    TR Peak grad:   21.3 mmHg MV Decel Time: 268 msec    TR Vmax:        231.00 cm/s MV E velocity: 64.70 cm/s MV A velocity: 73.70 cm/s  SHUNTS MV E/A ratio:  0.88        Systemic VTI:  0.19 m                            Systemic Diam: 2.20 cm Nona Dell MD Electronically signed by Nona Dell MD Signature Date/Time: 05/20/2020/3:40:36 PM    Final       Subjective: No new complaints.   Discharge Exam: Vitals:   05/22/20 0708 05/22/20 0823  BP: (!) 118/58 126/65  Pulse: 77 68  Resp: 16 20  Temp: 98.7 F (37.1 C) 98.4 F (36.9 C)  SpO2: 98% 97%   Vitals:   05/21/20 1614 05/21/20 2131 05/22/20 0708 05/22/20 0823  BP: (!) 110/57 (!) 106/58 (!) 118/58 126/65  Pulse: 72 (!) 59 77 68  Resp: 18 16 16 20   Temp: 98.4 F (36.9 C) 98.2 F (36.8 C) 98.7 F (37.1 C) 98.4 F (36.9 C)  TempSrc:  Oral Oral Oral  SpO2: 96% 96% 98% 97%    General: Pt is alert, awake, not in acute distress Cardiovascular: RRR, S1/S2 +, no rubs, no gallops Respiratory: CTA bilaterally, no wheezing, no rhonchi Abdominal: Soft, NT, ND, bowel sounds + Extremities: no edema, no cyanosis    The results of significant diagnostics from this hospitalization (including imaging, microbiology, ancillary and laboratory) are listed below for reference.     Microbiology: Recent Results (from the past 240 hour(s))  SARS Coronavirus 2 by RT PCR (hospital order, performed in [facility] hospital lab) Nasopharyngeal Nasopharyngeal Swab     Status: None   Collection Time: 05/19/20 12:56 PM   Specimen: Nasopharyngeal Swab  Result Value Ref Range Status   SARS Coronavirus 2 NEGATIVE NEGATIVE Final    Comment: (NOTE) SARS-CoV-2 target nucleic acids are NOT DETECTED.  The SARS-CoV-2 RNA is generally detectable in upper and lower respiratory specimens during the acute phase of infection. The lowest concentration  of SARS-CoV-2 viral copies this assay can detect is 250 copies / mL. A negative result does not preclude SARS-CoV-2 infection and should not be used as the sole basis for treatment or other patient management decisions.  A negative result may occur with improper specimen collection / handling, submission of specimen other than nasopharyngeal swab, presence of viral mutation(s) within the areas targeted by this assay, and inadequate number of viral copies (<250 copies / mL). A negative result must be combined with clinical observations, patient history, and epidemiological information.  Fact Sheet for Patients:   05/21/20  Fact Sheet for Healthcare Providers: BoilerBrush.com.cy  This test is not yet approved or  cleared by the https://pope.com/ FDA and has been authorized for detection and/or diagnosis of SARS-CoV-2 by FDA under an Emergency Use Authorization (EUA).  This EUA will remain in effect (meaning this test can be used) for the duration of the COVID-19 declaration under Section 564(b)(1) of the Act, 21 U.S.C. section 360bbb-3(b)(1), unless the authorization is terminated or revoked sooner.  Performed at [facility]  Hospital Lab, [address], [place], [place] [address]      Labs: BNP (last 3 results) No results for input(s): BNP in the last 8760 hours. Basic Metabolic Panel: Recent Labs  Lab 05/19/20 1255 05/20/20 1128  NA 139 138  K 4.4 4.1  CL 105 106  CO2 22 23  GLUCOSE 127* 113*  BUN 28* 21  CREATININE 1.14* 0.96  CALCIUM 9.0 9.0   Liver Function Tests: No results for input(s): AST, ALT, ALKPHOS, BILITOT, PROT, ALBUMIN in the last 168 hours. No results for input(s): LIPASE, AMYLASE in the last 168 hours. No results for input(s): AMMONIA in the last 168 hours. CBC: Recent Labs  Lab 05/19/20 1255 05/20/20 1128  WBC 8.2 6.8  NEUTROABS 5.7 4.9  HGB 10.8* 10.6*  HCT 34.9* 33.6*  MCV 100.6* 95.7  PLT 269  254   Cardiac Enzymes: No results for input(s): CKTOTAL, CKMB, CKMBINDEX, TROPONINI in the last 168 hours. BNP: Invalid input(s): POCBNP CBG: Recent Labs  Lab 05/19/20 1301  GLUCAP 117*   D-Dimer No results for input(s): DDIMER in the last 72 hours. Hgb A1c No results for input(s): HGBA1C in the last 72 hours. Lipid Profile No results for input(s): CHOL, HDL, LDLCALC, TRIG, CHOLHDL, LDLDIRECT in the last 72 hours. Thyroid function studies No results for input(s): TSH, T4TOTAL, T3FREE, THYROIDAB in the last 72 hours.  Invalid input(s): FREET3 Anemia work up No results for input(s): VITAMINB12, FOLATE, FERRITIN, TIBC, IRON, RETICCTPCT in the last 72 hours. Urinalysis    Component Value Date/Time   COLORURINE YELLOW 05/20/2020 0320   APPEARANCEUR HAZY (A) 05/20/2020 0320   LABSPEC 1.016 05/20/2020 0320   PHURINE 8.0 05/20/2020 0320   GLUCOSEU NEGATIVE 05/20/2020 0320   HGBUR NEGATIVE 05/20/2020 0320   BILIRUBINUR NEGATIVE 05/20/2020 0320   KETONESUR NEGATIVE 05/20/2020 0320   PROTEINUR NEGATIVE 05/20/2020 0320   UROBILINOGEN 0.2 04/29/2009 1144   NITRITE NEGATIVE 05/20/2020 0320   LEUKOCYTESUR SMALL (A) 05/20/2020 0320   Sepsis Labs Invalid input(s): PROCALCITONIN,  WBC,  LACTICIDVEN Microbiology Recent Results (from the past 240 hour(s))  SARS Coronavirus 2 by RT PCR (hospital order, performed in [facility] hospital lab) Nasopharyngeal Nasopharyngeal Swab     Status: None   Collection Time: 05/19/20 12:56 PM   Specimen: Nasopharyngeal Swab  Result Value Ref Range Status   SARS Coronavirus 2 NEGATIVE NEGATIVE Final    Comment: (NOTE) SARS-CoV-2 target nucleic acids are NOT DETECTED.  The SARS-CoV-2 RNA is generally detectable in upper and lower respiratory specimens during the acute phase of infection. The lowest concentration of SARS-CoV-2 viral copies this assay can detect is 250 copies / mL. A negative result does not preclude SARS-CoV-2 infection and should  not be used as the sole basis for treatment or other patient management decisions.  A negative result may occur with improper specimen collection / handling, submission of specimen other than nasopharyngeal swab, presence of viral mutation(s) within the areas targeted by this assay, and inadequate number of viral copies (<250 copies / mL). A negative result must be combined with clinical observations, patient history, and epidemiological information.  Fact Sheet for Patients:   BoilerBrush.com.cy  Fact Sheet for Healthcare Providers: https://pope.com/  This test is not yet approved or  cleared by the [place] FDA and has been authorized for detection and/or diagnosis of SARS-CoV-2 by FDA under an Emergency Use Authorization (EUA).  This EUA will remain in effect (meaning this test can be used) for the duration of the  COVID-19 declaration under Section 564(b)(1) of the Act, 21 U.S.C. section 360bbb-3(b)(1), unless the authorization is terminated or revoked sooner.  Performed at [facility], [address], [place], [place] [address]      Time coordinating discharge: 34 minutes.  SIGNED:   Kathlen Mody, MD  [facility] 05/22/2020, 3:33 PM

## 2020-05-22 NOTE — TOC Transition Note (Signed)
Transition of Care [facility]) - CM/SW Discharge Note   Patient Details  Name: Sylvia Davila MRN: [id number] Date of Birth: 26-Nov-1926  Transition of Care [facility]) CM/SW Contact:  Lorri Frederick, LCSW Phone Number: 05/22/2020, 4:54 PM   Clinical Narrative:   Pt returning to [facility] memory care.  Per Efraim Kaufmann, PT/OT services will be set up through [facility], along with palliative services.  RN call report to [phone].    Final next level of care: Assisted Living Barriers to Discharge: Barriers Resolved   Patient Goals and CMS Choice        Discharge Placement              Patient chooses bed at:  [facility] memory care) Patient to be transferred to facility by: [facility] Name of family member notified: [facility] Patient and family notified of of transfer: 05/22/20  Discharge Plan and Services     Post Acute Care Choice: Resumption of Svcs/PTA Provider                               Social Determinants of Health (SDOH) Interventions     Readmission Risk Interventions No flowsheet data found.

## 2020-05-22 NOTE — Progress Notes (Signed)
   05/22/20 1254  Clinical Encounter Type  Visited With Patient  Visit Type Initial;Spiritual support  Referral From Palliative care team  Consult/Referral To Chaplain  This chaplain responded to PMT consult for spiritual care.  The chaplain is bedside with the Pt.  The chaplain listened to the Pt. tearfully describe her regret in the death of a child and desire to go home.  The Pt. was comforted by holding the chaplain's hand.  The chaplain is available for F/U spiritual care as needed.

## 2020-05-27 ENCOUNTER — Emergency Department (HOSPITAL_COMMUNITY): Payer: Medicare Other

## 2020-05-27 ENCOUNTER — Other Ambulatory Visit: Payer: Self-pay

## 2020-05-27 ENCOUNTER — Emergency Department (HOSPITAL_COMMUNITY)
Admission: EM | Admit: 2020-05-27 | Discharge: 2020-05-27 | Disposition: A | Discharge status: Skilled Nursing Facility | Payer: Medicare Other | Attending: Emergency Medicine | Admitting: Emergency Medicine

## 2020-05-27 DIAGNOSIS — Y939 Activity, unspecified: Secondary | ICD-10-CM | POA: Insufficient documentation

## 2020-05-27 DIAGNOSIS — W1839XA Other fall on same level, initial encounter: Secondary | ICD-10-CM | POA: Insufficient documentation

## 2020-05-27 DIAGNOSIS — M79604 Pain in right leg: Secondary | ICD-10-CM | POA: Insufficient documentation

## 2020-05-27 DIAGNOSIS — Z79899 Other long term (current) drug therapy: Secondary | ICD-10-CM | POA: Diagnosis not present

## 2020-05-27 DIAGNOSIS — Y999 Unspecified external cause status: Secondary | ICD-10-CM | POA: Diagnosis not present

## 2020-05-27 DIAGNOSIS — R410 Disorientation, unspecified: Secondary | ICD-10-CM | POA: Diagnosis not present

## 2020-05-27 DIAGNOSIS — Y929 Unspecified place or not applicable: Secondary | ICD-10-CM | POA: Insufficient documentation

## 2020-05-27 DIAGNOSIS — S51011A Laceration without foreign body of right elbow, initial encounter: Secondary | ICD-10-CM | POA: Diagnosis present

## 2020-05-27 DIAGNOSIS — F0390 Unspecified dementia without behavioral disturbance: Secondary | ICD-10-CM | POA: Diagnosis not present

## 2020-05-27 DIAGNOSIS — M79605 Pain in left leg: Secondary | ICD-10-CM | POA: Diagnosis not present

## 2020-05-27 DIAGNOSIS — W19XXXA Unspecified fall, initial encounter: Secondary | ICD-10-CM

## 2020-05-27 NOTE — ED Triage Notes (Signed)
Pt from [facility]. Found on floor at facility. Denies LOC and hitting head. C/o R elbow and knee pain. HX of dementia and on Blood thinners. No obvious deformity seen.

## 2020-05-27 NOTE — ED Notes (Signed)
RN attempted to call [facility] to give report to staff regarding patient's discharge. Left message on voicemail

## 2020-05-27 NOTE — ED Provider Notes (Signed)
[facility]-EMERGENCY DEPT Provider Note   CSN: [id number] Arrival date & time: 05/27/20  1654     History Chief Complaint  Patient presents with  . Fall    Sylvia Davila is a 86 y.o. female with history of advanced dementia, paroxysmal A. fib on Xarelto, hypertension, aortic aneurysm, RA and recent hospitalization for syncope brought to the ED from [facility] by EMS for evaluation of fall.  Level 5 caveat due to advanced dementia.  Patient is only able to tell me her first name.  She cannot tell me exactly what happened but does say that she fell.  When asked about the events leading to her fall she starts talking about an outlet on the wall but history does not appear to be reliable.  She denies any pain.  She thinks she hit her head but cannot tell me how.  1748: I called [facility] and spoke to [place] the tech who called 911.  States patient refuses to use her walker.  She is reminded frequently to walk with her walker but she forgets due to her dementia.  Today a resident reportedly saw her fall when she was trying to walk without her walker and notified staff.  Patient complained of pain in her right shoulder and both of her legs so EMS was called.  Per staff there was no witnessed syncope.  Patient has been well otherwise without any significant events including fever, cough, vomiting or diarrhea.  HPI     Past Medical History:  Diagnosis Date  . Atrial fibrillation (HCC)   . Barrett's esophagus   . COVID-19   . Dementia (HCC)   . Paroxysmal atrial fibrillation (HCC)   . Pulmonary emboli (HCC)   . Respiratory failure [facility])     Patient Active Problem List   Diagnosis Date Noted  . Palliative care by specialist   . Goals of care, counseling/discussion   . Physical deconditioning   . Alzheimer's dementia with behavioral disturbance (HCC)   . Delirium   . DNR (do not resuscitate)   . Syncope 05/20/2020  . Syncope and collapse 05/19/2020    No past  surgical history on file.   OB History   No obstetric history on file.     No family history on file.  Social History   Tobacco Use  . Smoking status: Never Smoker  . Smokeless tobacco: Never Used  Substance Use Topics  . Alcohol use: Never  . Drug use: Never    Home Medications Prior to Admission medications   Medication Sig Start Date End Date Taking? Authorizing Provider  acetaminophen (TYLENOL) 325 MG tablet Take 650 mg by mouth every 8 (eight) hours as needed (pain).    [provider]  alendronate (FOSAMAX) 70 MG tablet Take 70 mg by mouth every Monday.  10/16/16   [provider]  amLODipine (NORVASC) 5 MG tablet Take 5 mg by mouth daily.     [provider]  atorvastatin (LIPITOR) 20 MG tablet Take 20 mg by mouth daily.    [provider]  Calcium Carbonate 500 MG CHEW Chew 500 mg by mouth daily. Tablet Patient not taking: Reported on 05/19/2020    [provider]  diclofenac Sodium (VOLTAREN) 1 % GEL Apply 1 application topically 3 (three) times daily.     [provider]  docusate sodium (COLACE CLEAR) 50 MG capsule Take 50 mg by mouth every 12 (twelve) hours as needed for mild constipation.  [provider]  famotidine-calcium carbonate-magnesium hydroxide (PEPCID COMPLETE) 10-800-165 MG chewable tablet Chew 1 tablet by mouth every evening.     [provider]  FOLIC ACID PO Take 1 tablet by mouth daily.     [provider]  furosemide (LASIX) 20 MG tablet Take 20 mg by mouth daily.    [provider]  Lecithin 1200 MG CAPS Take 1,200 mg by mouth daily.    [provider]  LORazepam (ATIVAN) 0.5 MG tablet Take 0.5 mg by mouth 3 (three) times daily.    [provider]  magnesium hydroxide (MILK OF MAGNESIA) 400 MG/5ML suspension Take 15 mLs by mouth daily as needed (constipation).    [provider]  methotrexate (RHEUMATREX) 2.5 MG tablet Take 10 mg by  mouth every Friday. Caution:Chemotherapy. Protect from light.    [provider]  nystatin (MYCOSTATIN/NYSTOP) powder Apply 1 application topically 2 (two) times daily. Apply under both breasts    [provider]  Omega-3 1000 MG CAPS Take 2,000 mg by mouth daily.     [provider]  ondansetron (ZOFRAN) 4 MG tablet Take 4 mg by mouth every 8 (eight) hours as needed for nausea or vomiting.     [provider]  pantoprazole (PROTONIX) 20 MG tablet Take 20 mg by mouth daily.    [provider]  polyethylene glycol powder (GLYCOLAX/MIRALAX) 17 GM/SCOOP powder Take 17 g by mouth See admin instructions. Mix 17 g in 4-8 oz liquid and drink daily, may also take 17 g daily as needed for constipation    [provider]  potassium chloride (KLOR-CON) 10 MEQ tablet Take 10 mEq by mouth daily.    [provider]  prochlorperazine (COMPAZINE) 10 MG tablet Take 10 mg by mouth every 8 (eight) hours as needed. Patient not taking: Reported on 05/19/2020 07/27/18   [provider]  PSYLLIUM PO Take 1 packet by mouth daily. Mix in 4-8 oz liquid and drink    [provider]  rivaroxaban (XARELTO) 20 MG TABS tablet Take 20 mg by mouth every evening.     [provider]  sertraline (ZOLOFT) 25 MG tablet Take 75 mg by mouth daily.    [provider]  sodium chloride 1 g tablet Take 1 g by mouth at bedtime.     [provider]    Allergies    Meclizine and Penicillins  Review of Systems   Review of Systems  Unable to perform ROS: Dementia  Hematological: Bruises/bleeds easily.  All other systems reviewed and are negative.   Physical Exam Updated Vital Signs BP (!) 158/88 (BP Location: Left Arm)   Pulse 85   Temp (!) 97.3 F (36.3 C) (Oral)   Resp 20   SpO2 100%   Physical Exam Vitals and nursing note reviewed.  Constitutional:      General: She is not in acute distress.    Appearance: She is  well-developed.     Comments: NAD.  Awake in [place] bed.  HENT:     Head: Normocephalic and atraumatic.     Comments: No tenderness or signs of trauma on facial/scalp bones.    Right Ear: External ear normal.     Left Ear: External ear normal.     Nose: Nose normal.     Mouth/Throat:     Comments: Maxilla, mandible stable without tenderness.  Normal and stable dentition. Eyes:     General: No scleral icterus.    Conjunctiva/sclera:  Conjunctivae normal.  Neck:     Comments: No cervical collar in place.  No midline or paraspinal neck tenderness.  Full range of motion of the neck without pain. Cardiovascular:     Rate and Rhythm: Normal rate and regular rhythm.     Heart sounds: Normal heart sounds.  Pulmonary:     Effort: Pulmonary effort is normal.     Breath sounds: Normal breath sounds.  Abdominal:     Palpations: Abdomen is soft.     Tenderness: There is no abdominal tenderness.  Musculoskeletal:        General: Normal range of motion.     Cervical back: Normal range of motion and neck supple.     Comments: Patient able to sit up on her own without assistance.  No midline TL spine or paraspinal muscle tenderness.  Pelvis stable, no pain with APL compression.  Full range of motion of hips including flexion, internal/external rotation without pain.  No leg shortening or rotation.  Full passive range of motion of upper and lower extremities performed without pain.  Skin:    General: Skin is warm and dry.     Capillary Refill: Capillary refill takes less than 2 seconds.     Comments: 2 cm small skin tear over right lateral elbow, locally tender.  Full range of motion of the elbow without significant pain.  Neurological:     Mental Status: She is alert. She is disoriented.     Comments:  Mental Status: Patient is awake, alert, oriented to first name only.  Disoriented to place, year, situation.  Patient unable to give a clear and coherent history. Speech is fluent and clear without  dysarthria or aphasia. No signs of neglect.  Cranial Nerves: I and II not tested due to patient's poor cooperation.  PERRL bilaterally.   III, IV, VI EOMs intact without ptosis or diplopia  V sensation to light touch intact in all 3 divisions of trigeminal nerve bilaterally  VII facial movements symmetric bilaterally VIII hearing intact to voice/conversation  IX, X no uvula deviation, symmetric rise of soft palate/uvula XI 5/5 SCM and trapezius strength bilaterally  XII tongue protrusion midline, symmetric L/R movements  Motor: Strength 5/5 in upper/lower extremities.  Sensation to light touch intact in face, upper/lower extremities. No pronator drift. No leg drop.  Cerebellar: Cannot assess FTN due to patient cooperation.   Psychiatric:        Behavior: Behavior normal.        Thought Content: Thought content normal.        Judgment: Judgment normal.     ED Results / Procedures / Treatments   Labs (all labs ordered are listed, but only abnormal results are displayed) Labs Reviewed - No data to display  EKG None  Radiology DG Elbow Complete Right  Result Date: 05/27/2020 CLINICAL DATA:  Right elbow pain after fall.  Skin tear. EXAM: RIGHT ELBOW - COMPLETE 3+ VIEW COMPARISON:  None. FINDINGS: There is no evidence of fracture, dislocation, or joint effusion. Mild degenerative spurring of the coronoid. Alignment and joint spaces are maintained. Soft tissues are unremarkable. The site of skin tear is not well-defined by radiograph. No radiopaque foreign body. IMPRESSION: No acute fracture or subluxation. Mild degenerative spurring of the coronoid. Electronically Signed   By: Narda Rutherford M.D.   On: 05/27/2020 18:27   CT Head Wo Contrast  Result Date: 05/27/2020 CLINICAL DATA:  Head trauma. EXAM: CT HEAD WITHOUT CONTRAST CT CERVICAL SPINE WITHOUT CONTRAST  TECHNIQUE: Multidetector CT imaging of the head and cervical spine was performed following the standard protocol without  intravenous contrast. Multiplanar CT image reconstructions of the cervical spine were also generated. COMPARISON:  May 19, 2020 FINDINGS: CT HEAD FINDINGS Brain: No evidence of acute infarction, hemorrhage, hydrocephalus, extra-axial collection or mass lesion/mass effect. Moderate brain parenchymal volume loss and deep white matter microangiopathy. Vascular: Calcific atherosclerotic disease of the intracranial carotid arteries. Skull: Normal. Negative for fracture or focal lesion. Sinuses/Orbits: No acute finding. Other: None. CT CERVICAL SPINE FINDINGS Alignment: Straightening of the midcervical spine due to osteoarthritic changes with multiple facet arthropathy. Skull base and vertebrae: No acute fracture. No primary bone lesion or focal pathologic process. Soft tissues and spinal canal: No prevertebral fluid or swelling. No visible canal hematoma. Disc levels:  Multilevel osteoarthritic changes. Upper chest: Ground-glass opacities in the left upper lobe. Other: None. IMPRESSION: 1. No acute intracranial abnormality. 2. Atrophy, chronic microvascular disease. 3. No evidence of acute traumatic injury to the cervical spine. 4. Multilevel osteoarthritic changes of the cervical spine. 5. Ground-glass opacities in the left upper lobe, similar to the lesion demonstrated on prior neck and chest CTs. Aortic Atherosclerosis (ICD10-I70.0). Electronically Signed   By: Ted Mcalpine M.D.   On: 05/27/2020 18:26   CT Cervical Spine Wo Contrast  Result Date: 05/27/2020 CLINICAL DATA:  Head trauma. EXAM: CT HEAD WITHOUT CONTRAST CT CERVICAL SPINE WITHOUT CONTRAST TECHNIQUE: Multidetector CT imaging of the head and cervical spine was performed following the standard protocol without intravenous contrast. Multiplanar CT image reconstructions of the cervical spine were also generated. COMPARISON:  May 19, 2020 FINDINGS: CT HEAD FINDINGS Brain: No evidence of acute infarction, hemorrhage, hydrocephalus, extra-axial  collection or mass lesion/mass effect. Moderate brain parenchymal volume loss and deep white matter microangiopathy. Vascular: Calcific atherosclerotic disease of the intracranial carotid arteries. Skull: Normal. Negative for fracture or focal lesion. Sinuses/Orbits: No acute finding. Other: None. CT CERVICAL SPINE FINDINGS Alignment: Straightening of the midcervical spine due to osteoarthritic changes with multiple facet arthropathy. Skull base and vertebrae: No acute fracture. No primary bone lesion or focal pathologic process. Soft tissues and spinal canal: No prevertebral fluid or swelling. No visible canal hematoma. Disc levels:  Multilevel osteoarthritic changes. Upper chest: Ground-glass opacities in the left upper lobe. Other: None. IMPRESSION: 1. No acute intracranial abnormality. 2. Atrophy, chronic microvascular disease. 3. No evidence of acute traumatic injury to the cervical spine. 4. Multilevel osteoarthritic changes of the cervical spine. 5. Ground-glass opacities in the left upper lobe, similar to the lesion demonstrated on prior neck and chest CTs. Aortic Atherosclerosis (ICD10-I70.0). Electronically Signed   By: Ted Mcalpine M.D.   On: 05/27/2020 18:26    Procedures Procedures (including critical care time)  Medications Ordered in ED Medications - No data to display  ED Course  I have reviewed the triage vital signs and the nursing notes.  Pertinent labs & imaging results that were available during my care of the patient were reviewed by me and considered in my medical decision making (see chart for details).  Clinical Course as of May 27 2041  Sat May 27, 2020  1851 1. No acute intracranial abnormality. 2. Atrophy, chronic microvascular disease. 3. No evidence of acute traumatic injury to the cervical spine. 4. Multilevel osteoarthritic changes of the cervical spine. 5. Ground-glass opacities in the left upper lobe, similar to the lesion demonstrated on prior neck and  chest CTs.    CT Cervical Spine Wo Contrast [CG]  1911  NSR HR 70s  ED EKG [CG]    Clinical Course User Index [CG] Jerrell Mylar   MDM Rules/Calculators/A&P                           86 yo F brought to ER from [facility] after fall.    I obtained additional history from triage, nursing notes and review of medical chart.  Previous medical records available, nursing notes reviewed to obtain more history and assist with MDM  Recent hospitalization reviewed. Briefly discharged on 8/30. Admitted for syncope secondary to bradycardia due to cardizem and beta blocker which were stopped.   Obtained additional history from [facility] EMT who called 911, as above.  Fall sounds like it was mechanical.  I spoke to patient's daughter who confirms history, provided patient update.    I have ordered imaging studies including CT xray and EKG.    I have personally visualized and interpreted labs and imaging.    ER work up reveals no acute osseous injuries.  EKG with NSR HR in the 70s here.    I have re-evaluated the patient.   She is pleasantly confused but redirectable.  Appropriate for discharge back to SNF.  Updated daughter.  Discharge instructions included in AVS.  Final Clinical Impression(s) / ED Diagnoses Final diagnoses:  Fall, initial encounter  Skin tear of right elbow without complication, initial encounter    Rx / DC Orders ED Discharge Orders    None       Jerrell Mylar 05/27/20 2042    Lorre Nick, MD 05/28/20 [phone]

## 2020-05-27 NOTE — ED Notes (Signed)
[facility] called for transport back to [facility]

## 2020-05-27 NOTE — Discharge Instructions (Addendum)
Sylvia Davila was seen in the ER after a fall  CT and x-rays did not show any new injuries  EKG showed normal sinus rhythm with a heart rate in the 70s  Her daughter [name] was updated on ER course of care and discharge plan  Please encourage [name] to use her walker at all times and monitor as she is a high risk patient  Monitor skin tear over right elbow, keep covered until no longer oozing blood.  Cover with non adherent dressing. Monitor for infection.

## 2020-05-31 ENCOUNTER — Telehealth: Payer: Self-pay

## 2020-05-31 NOTE — Telephone Encounter (Signed)
REFERRAL ONLY FROM [facility], SENT REFERRAL TO SCHEDULING

## 2020-06-18 NOTE — Progress Notes (Signed)
Electrophysiology Office Note:    Date:  06/19/2020   ID:  Sylvia Davila, DOB 1927-06-11, MRN [id number]  PCP:  System, Provider Not In  [facility] Cardiologist:  No primary care provider on file.  [facility] Electrophysiologist:  None   Referring MD: No ref. provider found   Chief Complaint: Syncope, recent hospitalization  History of Present Illness:     Sylvia Davila is a 86 y.o. female who presents for an evaluation of syncope at the request of Dr Blake Divine. Their medical history includes dementia, atrial fibrillation, HLD, anemia of chronic disease, and HTN. She was recently hospitalized 05/19/2020 through 05/22/2020 after being found unresponsive at home with a HR in the 30s. Her cardizem was held and her heart rates rose to the 60s without recurrent loss of consciousness. She continues to take Atenolol 50mg  daily.   Past Medical History:  Diagnosis Date  . Atrial fibrillation (HCC)   . Barrett's esophagus   . Bradycardia   . COVID-19   . Dementia (HCC)   . Hyperlipidemia   . Hypertension   . Paroxysmal atrial fibrillation (HCC)   . Pulmonary emboli (HCC)   . RA (rheumatoid arthritis) (HCC)   . Respiratory failure (HCC)   . Syncope and collapse     No past surgical history on file.  Current Medications: Current Meds  Medication Sig  . acetaminophen (TYLENOL) 325 MG tablet Take 650 mg by mouth every 8 (eight) hours as needed (pain).  alendronate (FOSAMAX) 70 MG tablet Take 70 mg by mouth every Monday.   Saturday amLODipine (NORVASC) 5 MG tablet Take 5 mg by mouth daily.   Marland Kitchen atorvastatin (LIPITOR) 20 MG tablet Take 20 mg by mouth daily.  . Calcium Carbonate 500 MG CHEW Chew 500 mg by mouth daily. Tablet  . diclofenac Sodium (VOLTAREN) 1 % GEL Apply 1 application topically 3 (three) times daily.   Marland Kitchen docusate sodium (COLACE CLEAR) 50 MG capsule Take 50 mg by mouth every 12 (twelve) hours as needed for mild constipation.   . famotidine-calcium carbonate-magnesium  hydroxide (PEPCID COMPLETE) 10-800-165 MG chewable tablet Chew 1 tablet by mouth every evening.   Marland Kitchen FOLIC ACID PO Take 1 tablet by mouth daily.   . furosemide (LASIX) 20 MG tablet Take 20 mg by mouth daily.  . Lecithin 1200 MG CAPS Take 1,200 mg by mouth daily.  Marland Kitchen LORazepam (ATIVAN) 0.5 MG tablet Take 0.5 mg by mouth 3 (three) times daily.  . magnesium hydroxide (MILK OF MAGNESIA) 400 MG/5ML suspension Take 15 mLs by mouth daily as needed (constipation).  . methotrexate (RHEUMATREX) 2.5 MG tablet Take 10 mg by mouth every Friday. Caution:Chemotherapy. Protect from light.  . nystatin (MYCOSTATIN/NYSTOP) powder Apply 1 application topically 2 (two) times daily. Apply under both breasts  . Omega-3 1000 MG CAPS Take 2,000 mg by mouth daily.   . ondansetron (ZOFRAN) 4 MG tablet Take 4 mg by mouth every 8 (eight) hours as needed for nausea or vomiting.   . pantoprazole (PROTONIX) 20 MG tablet Take 20 mg by mouth daily.  . polyethylene glycol powder (GLYCOLAX/MIRALAX) 17 GM/SCOOP powder Take 17 g by mouth See admin instructions. Mix 17 g in 4-8 oz liquid and drink daily, may also take 17 g daily as needed for constipation  . potassium chloride (KLOR-CON) 10 MEQ tablet Take 10 mEq by mouth daily.  . prochlorperazine (COMPAZINE) 10 MG tablet Take 10 mg by mouth every 8 (eight) hours as needed.   . PSYLLIUM PO Take  1 packet by mouth daily. Mix in 4-8 oz liquid and drink  . rivaroxaban (XARELTO) 20 MG TABS tablet Take 20 mg by mouth every evening.   . sertraline (ZOLOFT) 25 MG tablet Take 75 mg by mouth daily.  . sodium chloride 1 g tablet Take 1 g by mouth at bedtime.   . [DISCONTINUED] atenolol (TENORMIN) 50 MG tablet Take 50 mg by mouth daily.     Allergies:   Meclizine and Penicillins   Social History   Socioeconomic History  . Marital status: Widowed    Spouse name: Not on file  . Number of children: Not on file  . Years of education: Not on file  . Highest education level: Not on file    Occupational History  . Not on file  Tobacco Use  . Smoking status: Never Smoker  . Smokeless tobacco: Never Used  Substance and Sexual Activity  . Alcohol use: Never  . Drug use: Never  . Sexual activity: Not Currently  Other Topics Concern  . Not on file  Social History Narrative   ** Merged History Encounter **       Social Determinants of Health   Financial Resource Strain:   . Difficulty of Paying Living Expenses: Not on file  Food Insecurity:   . Worried About Programme researcher, broadcasting/film/video in the Last Year: Not on file  . Ran Out of Food in the Last Year: Not on file  Transportation Needs:   . Lack of Transportation (Medical): Not on file  . Lack of Transportation (Non-Medical): Not on file  Physical Activity:   . Days of Exercise per Week: Not on file  . Minutes of Exercise per Session: Not on file  Stress:   . Feeling of Stress : Not on file  Social Connections:   . Frequency of Communication with Friends and Family: Not on file  . Frequency of Social Gatherings with Friends and Family: Not on file  . Attends Religious Services: Not on file  . Active Member of Clubs or Organizations: Not on file  . Attends Banker Meetings: Not on file  . Marital Status: Not on file     Family History: The patient's family history is not on file.  ROS:   Please see the history of present illness.    All other systems reviewed and are negative.  EKGs/Labs/Other Studies Reviewed:    The following studies were reviewed today: Echo, ECG, recent hospitalization records  05/20/2020 Echo personally reviewed EF normal, 60%. No significant valvular abnormalities.  EKG:  The ekg ordered today demonstrates sinus rhythm at 81bpm.  Recent Labs: 11/22/2019: ALT 16 05/20/2020: BUN 21; Creatinine, Ser 0.96; Hemoglobin 10.6; Platelets 254; Potassium 4.1; Sodium 138  Recent Lipid Panel No results found for: CHOL, TRIG, HDL, CHOLHDL, VLDL, LDLCALC, LDLDIRECT  Physical Exam:     VS:  BP 106/62   Pulse 81   Ht 5\' 3"  (1.6 m)   Wt 142 lb (64.4 kg)   SpO2 96%   BMI 25.15 kg/m     Recheck of BP in the room 94/60.  Wt Readings from Last 3 Encounters:  06/19/20 142 lb (64.4 kg)  01/13/20 165 lb (74.8 kg)     GEN: Anxious appearing.  Frail. In wheelchair. HEENT: Normal NECK: No JVD; No carotid bruits LYMPHATICS: No lymphadenopathy CARDIAC: RRR, no murmurs, rubs, gallops RESPIRATORY:  Clear to auscultation without rales, wheezing or rhonchi  ABDOMEN: Soft, non-tender, non-distended MUSCULOSKELETAL:  No edema; No  deformity  SKIN: Warm and dry NEUROLOGIC:  Alert. PSYCHIATRIC:  Normal affect   ASSESSMENT:    1. Syncope and collapse   2. Alzheimer's dementia with behavioral disturbance, unspecified timing of dementia onset (HCC)    PLAN:    In order of problems listed above:  1. Syncope Concerning for symptomatic bradycardia while she was taking BB and CCB. Improved since changing medications. I am concerned the Atenolol 50mg  daily is contributing to some hypotension which may have been a contributing factor to her syncope. Will decrease this to 25mg  once daily. F/u 6 months  2. Alzheimer's disease Lives at [facility].  Medication Adjustments/Labs and Tests Ordered: Current medicines are reviewed at length with the patient today.  Concerns regarding medicines are outlined above.  Orders Placed This Encounter  Procedures  . EKG 12-Lead   Meds ordered this encounter  Medications  . atenolol (TENORMIN) 25 MG tablet    Sig: Take 1 tablet (25 mg total) by mouth daily.    Dispense:  90 tablet    Refill:  3     Signed, Steffanie Dunn, MD, [facility]  06/19/2020 11:25 AM    Electrophysiology [facility]

## 2020-06-19 ENCOUNTER — Ambulatory Visit (INDEPENDENT_AMBULATORY_CARE_PROVIDER_SITE_OTHER): Payer: Medicare Other | Admitting: Cardiology

## 2020-06-19 ENCOUNTER — Other Ambulatory Visit: Payer: Self-pay

## 2020-06-19 VITALS — BP 106/62 | HR 81 | Ht 63.0 in | Wt 142.0 lb

## 2020-06-19 DIAGNOSIS — R55 Syncope and collapse: Secondary | ICD-10-CM

## 2020-06-19 DIAGNOSIS — F0281 Dementia in other diseases classified elsewhere with behavioral disturbance: Secondary | ICD-10-CM

## 2020-06-19 DIAGNOSIS — G309 Alzheimer's disease, unspecified: Secondary | ICD-10-CM | POA: Diagnosis not present

## 2020-06-19 MED ORDER — ATENOLOL 25 MG PO TABS: 25.0000 mg | ORAL_TABLET | Freq: Every day | ORAL | 3 refills | Status: AC

## 2020-06-19 NOTE — Patient Instructions (Signed)
Medication Instructions:  Reduce your atenolol to 25 mg daily.  Take 25 mg by mouth once daily.  *If you need a refill on your cardiac medications before your next appointment, please call your pharmacy*  Lab Work: None ordered.  If you have labs (blood work) drawn today and your tests are completely normal, you will receive your results only by: Marland Kitchen MyChart Message (if you have MyChart) OR . A paper copy in the mail If you have any lab test that is abnormal or we need to change your treatment, we will call you to review the results.  Testing/Procedures: None ordered.  Follow-Up: At [facility], you and your health needs are our priority.  As part of our continuing mission to provide you with exceptional heart care, we have created designated Provider Care Teams.  These Care Teams include your primary Cardiologist (physician) and Advanced Practice Providers (APPs -  Physician Assistants and Nurse Practitioners) who all work together to provide you with the care you need, when you need it.  We recommend signing up for the patient portal called "MyChart".  Sign up information is provided on this After Visit Summary.  MyChart is used to connect with patients for Virtual Visits (Telemedicine).  Patients are able to view lab/test results, encounter notes, upcoming appointments, etc.  Non-urgent messages can be sent to your provider as well.   To learn more about what you can do with MyChart, go to [email or web address].    Your next appointment:   Your physician wants you to follow-up in: 6 months You will receive a reminder letter in the mail two months in advance. If you don't receive a letter, please call our office to schedule the follow-up appointment.     Other Instructions:

## 2020-06-29 ENCOUNTER — Emergency Department (HOSPITAL_COMMUNITY): Payer: Medicare Other

## 2020-06-29 ENCOUNTER — Emergency Department (HOSPITAL_COMMUNITY)
Admission: EM | Admit: 2020-06-29 | Discharge: 2020-06-29 | Disposition: A | Discharge status: Home / Self Care | Payer: Medicare Other | Attending: Emergency Medicine | Admitting: Emergency Medicine

## 2020-06-29 ENCOUNTER — Encounter (HOSPITAL_COMMUNITY): Payer: Self-pay

## 2020-06-29 ENCOUNTER — Other Ambulatory Visit: Payer: Self-pay

## 2020-06-29 DIAGNOSIS — Z79899 Other long term (current) drug therapy: Secondary | ICD-10-CM | POA: Diagnosis not present

## 2020-06-29 DIAGNOSIS — F0281 Dementia in other diseases classified elsewhere with behavioral disturbance: Secondary | ICD-10-CM | POA: Insufficient documentation

## 2020-06-29 DIAGNOSIS — M549 Dorsalgia, unspecified: Secondary | ICD-10-CM | POA: Insufficient documentation

## 2020-06-29 DIAGNOSIS — Z7901 Long term (current) use of anticoagulants: Secondary | ICD-10-CM | POA: Insufficient documentation

## 2020-06-29 DIAGNOSIS — Z8616 Personal history of COVID-19: Secondary | ICD-10-CM | POA: Insufficient documentation

## 2020-06-29 DIAGNOSIS — G309 Alzheimer's disease, unspecified: Secondary | ICD-10-CM | POA: Insufficient documentation

## 2020-06-29 DIAGNOSIS — W19XXXA Unspecified fall, initial encounter: Secondary | ICD-10-CM | POA: Diagnosis not present

## 2020-06-29 DIAGNOSIS — M25551 Pain in right hip: Secondary | ICD-10-CM | POA: Diagnosis not present

## 2020-06-29 NOTE — ED Provider Notes (Signed)
[facility]-EMERGENCY DEPT Provider Note   CSN: [id number] Arrival date & time: 06/29/20  1821     History Chief Complaint  Patient presents with  . Fall    [name] Inabnit is a 86 y.o. female has been history of A. fib, bradycardia, dementia, hyperlipidemia who presents for evaluation for unwitnessed fall.  Patient brought in by EMS from [facility] unit.  Patient had an unwitnessed fall.  She is not on any blood thinners.  Patient had told EMS that she was having some pain in her right hip and right back.  EM LEVEL 5 CAVEAT DUE TO DEMENTIA  The history is provided by the patient.       Past Medical History:  Diagnosis Date  . Atrial fibrillation (HCC)   . Barrett's esophagus   . Bradycardia   . COVID-19   . Dementia (HCC)   . Hyperlipidemia   . Hypertension   . Paroxysmal atrial fibrillation (HCC)   . Pulmonary emboli (HCC)   . RA (rheumatoid arthritis) (HCC)   . Respiratory failure (HCC)   . Syncope and collapse     Patient Active Problem List   Diagnosis Date Noted  . Palliative care by specialist   . Goals of care, counseling/discussion   . Physical deconditioning   . Alzheimer's dementia with behavioral disturbance (HCC)   . Delirium   . DNR (do not resuscitate)   . Syncope 05/20/2020  . Syncope and collapse 05/19/2020    History reviewed. No pertinent surgical history.   OB History   No obstetric history on file.     History reviewed. No pertinent family history.  Social History   Tobacco Use  . Smoking status: Never Smoker  . Smokeless tobacco: Never Used  Substance Use Topics  . Alcohol use: Never  . Drug use: Never    Home Medications Prior to Admission medications   Medication Sig Start Date End Date Taking? Authorizing Provider  acetaminophen (TYLENOL) 325 MG tablet Take 650 mg by mouth every 8 (eight) hours as needed (pain).    [provider]  alendronate (FOSAMAX) 70 MG tablet Take 70 mg by  mouth every Monday.  10/16/16   [provider]  amLODipine (NORVASC) 5 MG tablet Take 5 mg by mouth daily.     [provider]  atenolol (TENORMIN) 25 MG tablet Take 1 tablet (25 mg total) by mouth daily. 06/19/20   Lanier Prude, MD  atorvastatin (LIPITOR) 20 MG tablet Take 20 mg by mouth daily.    [provider]  Calcium Carbonate 500 MG CHEW Chew 500 mg by mouth daily. Tablet    [provider]  diclofenac Sodium (VOLTAREN) 1 % GEL Apply 1 application topically 3 (three) times daily.     [provider]  docusate sodium (COLACE CLEAR) 50 MG capsule Take 50 mg by mouth every 12 (twelve) hours as needed for mild constipation.     [provider]  famotidine-calcium carbonate-magnesium hydroxide (PEPCID COMPLETE) 10-800-165 MG chewable tablet Chew 1 tablet by mouth every evening.     [provider]  FOLIC ACID PO Take 1 tablet by mouth daily.     [provider]  furosemide (LASIX) 20 MG tablet Take 20 mg by mouth daily.    [provider]  Lecithin 1200 MG CAPS Take 1,200 mg by mouth daily.    [provider]  LORazepam (ATIVAN) 0.5 MG tablet Take 0.5 mg by mouth 3 (three)  times daily.    [provider]  magnesium hydroxide (MILK OF MAGNESIA) 400 MG/5ML suspension Take 15 mLs by mouth daily as needed (constipation).    [provider]  methotrexate (RHEUMATREX) 2.5 MG tablet Take 10 mg by mouth every Friday. Caution:Chemotherapy. Protect from light.    [provider]  nystatin (MYCOSTATIN/NYSTOP) powder Apply 1 application topically 2 (two) times daily. Apply under both breasts    [provider]  Omega-3 1000 MG CAPS Take 2,000 mg by mouth daily.     [provider]  ondansetron (ZOFRAN) 4 MG tablet Take 4 mg by mouth every 8 (eight) hours as needed for nausea or vomiting.     [provider]  pantoprazole (PROTONIX) 20 MG tablet Take 20 mg by  mouth daily.    [provider]  polyethylene glycol powder (GLYCOLAX/MIRALAX) 17 GM/SCOOP powder Take 17 g by mouth See admin instructions. Mix 17 g in 4-8 oz liquid and drink daily, may also take 17 g daily as needed for constipation    [provider]  potassium chloride (KLOR-CON) 10 MEQ tablet Take 10 mEq by mouth daily.    [provider]  prochlorperazine (COMPAZINE) 10 MG tablet Take 10 mg by mouth every 8 (eight) hours as needed.  07/27/18   [provider]  PSYLLIUM PO Take 1 packet by mouth daily. Mix in 4-8 oz liquid and drink    [provider]  rivaroxaban (XARELTO) 20 MG TABS tablet Take 20 mg by mouth every evening.     [provider]  sertraline (ZOLOFT) 25 MG tablet Take 75 mg by mouth daily.    [provider]  sodium chloride 1 g tablet Take 1 g by mouth at bedtime.     [provider]    Allergies    Meclizine and Penicillins  Review of Systems   Review of Systems  Unable to perform ROS: Dementia    Physical Exam Updated Vital Signs BP (!) 141/66 (BP Location: Left Arm)   Pulse (!) 56   Temp (!) 97.4 F (36.3 C) (Oral)   Resp 17   SpO2 96%   Physical Exam Vitals and nursing note reviewed.  Constitutional:      Appearance: Normal appearance. She is well-developed.  HENT:     Head: Normocephalic and atraumatic.     Comments: No tenderness to palpation of skull. No deformities or crepitus noted. No open wounds, abrasions or lacerations.  Eyes:     General: Lids are normal.     Conjunctiva/sclera: Conjunctivae normal.     Pupils: Pupils are equal, round, and reactive to light.     Comments: PERRL. EOMs intact. No nystagmus. No neglect.   Neck:     Comments: Full flexion/extension and lateral movement of neck fully intact. No bony midline tenderness. No deformities or crepitus.  Cardiovascular:     Rate and Rhythm: Normal rate and regular rhythm.     Pulses: Normal pulses.           Radial pulses are 2+ on the right side and 2+ on the left side.       Dorsalis pedis pulses are 2+ on the right side and 2+ on the left side.     Heart sounds: Normal heart sounds. No murmur heard.  No friction rub. No gallop.   Pulmonary:     Effort: Pulmonary effort is normal.     Breath sounds: Normal breath sounds.  Comments: Lungs clear to auscultation bilaterally.  Symmetric chest rise.  No wheezing, rales, rhonchi. Abdominal:     Palpations: Abdomen is soft. Abdomen is not rigid.     Tenderness: There is no abdominal tenderness. There is no guarding.     Comments: Lungs clear to auscultation bilaterally.  Symmetric chest rise.  No wheezing, rales, rhonchi.  Musculoskeletal:        General: Normal range of motion.     Cervical back: Full passive range of motion without pain.     Comments: No midline T or L-spine tenderness.  No deformity or crepitus noted.  Mild tenderness into the anterior and lateral aspect the right hip.  No shortening or rotation.  No deformity or crepitus noted.  No bony tenderness to the right knee, right tib-fib, right ankle.  Flexion/tension of right lower extremity intact any difficulty.  No tenderness palpation on left lower extremity.  Skin:    General: Skin is warm and dry.     Capillary Refill: Capillary refill takes less than 2 seconds.  Neurological:     Mental Status: She is alert.     Comments: Follows commands.  Moves extremities spontaneously. Cranial nerves III-XII intact 5/5 strength to BUE and BLE  Sensation intact throughout all major nerve distributions No slurred speech. No facial droop.   Psychiatric:        Speech: Speech normal.     ED Results / Procedures / Treatments   Labs (all labs ordered are listed, but only abnormal results are displayed) Labs Reviewed - No data to display  EKG None  Radiology DG Lumbar Spine Complete  Result Date: 06/29/2020 CLINICAL DATA:  Fall, right back pain EXAM: LUMBAR SPINE - COMPLETE 4+  VIEW COMPARISON:  MRI 03/24/2014 FINDINGS: There is normal lumbar lordosis. There is no acute fracture of the lumbar spine. Severe compression deformity of T12 status post kyphoplasty is again identified. Remaining vertebral body height has been preserved. There is intervertebral disc space narrowing and endplate remodeling at L1-2 and, to a lesser extent, L2-3 in keeping with changes of moderate to severe degenerative disc disease. Grade 1 anterolisthesis of L4 upon L5 is unchanged, likely degenerative in nature. The paraspinal soft tissues are unremarkable. Facet arthrosis at L4-5 and L5-S1 is not well profiled on this examination. The paraspinal soft tissues are unremarkable. IMPRESSION: No acute fracture or listhesis of the lumbar spine. Stable compression deformity of T12 status post kyphoplasty. Electronically Signed   By: Helyn Numbers MD   On: 06/29/2020 20:24   CT Head Wo Contrast  Result Date: 06/29/2020 CLINICAL DATA:  86 year old female with trauma. EXAM: CT HEAD WITHOUT CONTRAST CT CERVICAL SPINE WITHOUT CONTRAST TECHNIQUE: Multidetector CT imaging of the head and cervical spine was performed following the standard protocol without intravenous contrast. Multiplanar CT image reconstructions of the cervical spine were also generated. COMPARISON:  CT dated 05/27/2020. FINDINGS: CT HEAD FINDINGS Brain: Moderate age-related atrophy and chronic microvascular ischemic changes. There is no acute intracranial hemorrhage. No mass effect or midline shift. No extra-axial fluid collection. Vascular: No hyperdense vessel or unexpected calcification. Skull: Normal. Negative for fracture or focal lesion. Sinuses/Orbits: No acute finding. Other: None CT CERVICAL SPINE FINDINGS Alignment: No acute subluxation. Skull base and vertebrae: No acute fracture. Osteopenia. Soft tissues and spinal canal: No prevertebral fluid or swelling. No visible canal hematoma. Disc levels:  Multilevel degenerative changes. Upper chest:  Biapical subpleural scarring. A 6 mm nodule versus scarring in the left apex. Other: Bilateral  carotid bulb calcified plaques. IMPRESSION: 1. No acute intracranial hemorrhage. Moderate age-related atrophy and chronic microvascular ischemic changes. 2. No acute/traumatic cervical spine pathology. Multilevel degenerative changes. Electronically Signed   By: Elgie Collard M.D.   On: 06/29/2020 20:40   CT Cervical Spine Wo Contrast  Result Date: 06/29/2020 CLINICAL DATA:  86 year old female with trauma. EXAM: CT HEAD WITHOUT CONTRAST CT CERVICAL SPINE WITHOUT CONTRAST TECHNIQUE: Multidetector CT imaging of the head and cervical spine was performed following the standard protocol without intravenous contrast. Multiplanar CT image reconstructions of the cervical spine were also generated. COMPARISON:  CT dated 05/27/2020. FINDINGS: CT HEAD FINDINGS Brain: Moderate age-related atrophy and chronic microvascular ischemic changes. There is no acute intracranial hemorrhage. No mass effect or midline shift. No extra-axial fluid collection. Vascular: No hyperdense vessel or unexpected calcification. Skull: Normal. Negative for fracture or focal lesion. Sinuses/Orbits: No acute finding. Other: None CT CERVICAL SPINE FINDINGS Alignment: No acute subluxation. Skull base and vertebrae: No acute fracture. Osteopenia. Soft tissues and spinal canal: No prevertebral fluid or swelling. No visible canal hematoma. Disc levels:  Multilevel degenerative changes. Upper chest: Biapical subpleural scarring. A 6 mm nodule versus scarring in the left apex. Other: Bilateral carotid bulb calcified plaques. IMPRESSION: 1. No acute intracranial hemorrhage. Moderate age-related atrophy and chronic microvascular ischemic changes. 2. No acute/traumatic cervical spine pathology. Multilevel degenerative changes. Electronically Signed   By: Elgie Collard M.D.   On: 06/29/2020 20:40   DG Hip Unilat W or Wo Pelvis 2-3 Views Right  Result Date:  06/29/2020 CLINICAL DATA:  Hip pain EXAM: DG HIP (WITH OR WITHOUT PELVIS) 2-3V RIGHT COMPARISON:  None. FINDINGS: Single view radiograph of the pelvis and two view radiograph of the right hip demonstrates normal alignment. No fracture or dislocation. Asymmetric degenerative changes are noted involving the right sacroiliac joint. Remaining joint spaces are preserved. Vascular calcifications are seen within the pelvis and medial right thigh. IMPRESSION: No acute fracture or dislocation. Electronically Signed   By: Helyn Numbers MD   On: 06/29/2020 20:18    Procedures Procedures (including critical care time)  Medications Ordered in ED Medications - No data to display  ED Course  I have reviewed the triage vital signs and the nursing notes.  Pertinent labs & imaging results that were available during my care of the patient were reviewed by me and considered in my medical decision making (see chart for details).    MDM Rules/Calculators/A&P                          86 year old female brought in by EMS from [place] [facility] memory care unit for evaluation of unwitnessed fall.  Patient with history of dementia.  Had told EMS she was having some right back and right hip pain.  On my evaluation, she denies any pain.  On initial arrival, she is afebrile nontoxic-appearing.  Vital signs are stable.  On exam, she initially had some tenderness on anterior and lateral aspect of the right hip.  When I but went back and reevaluated, and palpated on the area, she did not have any pain.  She can move the leg.  No tenderness noted on palpation of the lower lumbar region.  No obvious head injury.  Given her age, plan for imaging.  Discussed with RN and medtech Denny Peon) at [facility].  Patient with history of dementia and is confused at baseline.  They report that she had an unwitnessed fall.  They had  her dinner and went to take her back to her room.  An assistant was helping her get ready for bed and put her in  the recliner.  The assistant stepped out of the room in a few minutes later came back and found patient on the floor.  Patient was complaining of some pain to her head and back.  Med tech does not think patient is on Xarelto though it is listed in our chart.  They do not see any record of her being on blood thinners.  CT head shows CT C-spine negative for any acute traumatic injury.  X-ray of lumbar spine and hip are negative for any acute bony abnormality.  Patient is at baseline per nursing home.  Plan for discharge. Discussed patient with Dr. Anitra Lauth who is agreeable to plan.   Portions of this note were generated with Scientist, clinical (histocompatibility and immunogenetics). Dictation errors may occur despite best attempts at proofreading.   Final Clinical Impression(s) / ED Diagnoses Final diagnoses:  Fall, initial encounter    Rx / DC Orders ED Discharge Orders    None       Rosana Hoes 06/29/20 [id number]    Gwyneth Sprout, MD 06/29/20 2316

## 2020-06-29 NOTE — ED Triage Notes (Signed)
Pt BIB EMS from [facility] Unit. Pt had unwitnessed fall. Pt c/o right hip pain and right side back pain. EMS reports no obvious deformity. Pt has dementia. Pt refused c-collar with EMS. Unknown if pt hit head. Pt not on blood thinners.   96% RA 142/84 56 HR

## 2020-06-29 NOTE — Discharge Instructions (Signed)
Please follow-up with patient's primary care doctor.   Return for any vomiting, difficulty moving arms or legs or any other worsening concerning symptoms.

## 2020-06-29 NOTE — ED Notes (Addendum)
Pt placed on cardiac monitoring. Vitals obtained. Pt repositioned and given warm blankets. Call bell at bedside.

## 2020-07-06 ENCOUNTER — Other Ambulatory Visit: Payer: Self-pay

## 2020-07-06 ENCOUNTER — Emergency Department (HOSPITAL_COMMUNITY)
Admission: EM | Admit: 2020-07-06 | Discharge: 2020-07-06 | Disposition: A | Discharge status: Home / Self Care | Payer: Medicare Other | Attending: Emergency Medicine | Admitting: Emergency Medicine

## 2020-07-06 ENCOUNTER — Encounter (HOSPITAL_COMMUNITY): Payer: Self-pay | Admitting: *Deleted

## 2020-07-06 ENCOUNTER — Emergency Department (HOSPITAL_COMMUNITY): Payer: Medicare Other

## 2020-07-06 DIAGNOSIS — I480 Paroxysmal atrial fibrillation: Secondary | ICD-10-CM | POA: Diagnosis not present

## 2020-07-06 DIAGNOSIS — G309 Alzheimer's disease, unspecified: Secondary | ICD-10-CM | POA: Diagnosis not present

## 2020-07-06 DIAGNOSIS — Z043 Encounter for examination and observation following other accident: Secondary | ICD-10-CM | POA: Insufficient documentation

## 2020-07-06 DIAGNOSIS — Z7901 Long term (current) use of anticoagulants: Secondary | ICD-10-CM | POA: Insufficient documentation

## 2020-07-06 DIAGNOSIS — Z8616 Personal history of COVID-19: Secondary | ICD-10-CM | POA: Insufficient documentation

## 2020-07-06 DIAGNOSIS — Z86711 Personal history of pulmonary embolism: Secondary | ICD-10-CM | POA: Diagnosis not present

## 2020-07-06 DIAGNOSIS — F0281 Dementia in other diseases classified elsewhere with behavioral disturbance: Secondary | ICD-10-CM | POA: Diagnosis not present

## 2020-07-06 DIAGNOSIS — I10 Essential (primary) hypertension: Secondary | ICD-10-CM | POA: Diagnosis not present

## 2020-07-06 DIAGNOSIS — Z79899 Other long term (current) drug therapy: Secondary | ICD-10-CM | POA: Insufficient documentation

## 2020-07-06 DIAGNOSIS — W19XXXA Unspecified fall, initial encounter: Secondary | ICD-10-CM

## 2020-07-06 DIAGNOSIS — Y92122 Bedroom in nursing home as the place of occurrence of the external cause: Secondary | ICD-10-CM | POA: Insufficient documentation

## 2020-07-06 DIAGNOSIS — S0990XA Unspecified injury of head, initial encounter: Secondary | ICD-10-CM | POA: Diagnosis present

## 2020-07-06 DIAGNOSIS — W06XXXA Fall from bed, initial encounter: Secondary | ICD-10-CM | POA: Insufficient documentation

## 2020-07-06 DIAGNOSIS — N3001 Acute cystitis with hematuria: Secondary | ICD-10-CM | POA: Insufficient documentation

## 2020-07-06 LAB — URINALYSIS, ROUTINE W REFLEX MICROSCOPIC
Bilirubin Urine: NEGATIVE
Glucose, UA: NEGATIVE mg/dL
Ketones, ur: NEGATIVE mg/dL
Nitrite: POSITIVE — AB
Protein, ur: 100 mg/dL — AB
Specific Gravity, Urine: 1.016 (ref 1.005–1.030)
WBC, UA: >50 WBC/hpf — ABNORMAL HIGH (ref 0–5)
pH: 5 (ref 5.0–8.0)

## 2020-07-06 LAB — CBC WITH DIFFERENTIAL/PLATELET
Abs Immature Granulocytes: 0.05 10*3/uL (ref 0.00–0.07)
Basophils Absolute: 0.1 10*3/uL (ref 0.0–0.1)
Basophils Relative: 1 %
Eosinophils Absolute: 0 10*3/uL (ref 0.0–0.5)
Eosinophils Relative: 0 %
HCT: 37.9 % (ref 36.0–46.0)
Hemoglobin: 11.5 g/dL — ABNORMAL LOW (ref 12.0–15.0)
Immature Granulocytes: 1 %
Lymphocytes Relative: 12 %
Lymphs Abs: 1.1 10*3/uL (ref 0.7–4.0)
MCH: 28.8 pg (ref 26.0–34.0)
MCHC: 30.3 g/dL (ref 30.0–36.0)
MCV: 95 fL (ref 80.0–100.0)
Monocytes Absolute: 1.2 10*3/uL — ABNORMAL HIGH (ref 0.1–1.0)
Monocytes Relative: 13 %
Neutro Abs: 7.4 10*3/uL (ref 1.7–7.7)
Neutrophils Relative %: 73 %
Platelets: 226 10*3/uL (ref 150–400)
RBC: 3.99 MIL/uL (ref 3.87–5.11)
RDW: 14.9 % (ref 11.5–15.5)
WBC: 9.9 10*3/uL (ref 4.0–10.5)
nRBC: 0 % (ref 0.0–0.2)

## 2020-07-06 LAB — BASIC METABOLIC PANEL
Anion gap: 8 (ref 5–15)
BUN: 19 mg/dL (ref 8–23)
CO2: 25 mmol/L (ref 22–32)
Calcium: 9.3 mg/dL (ref 8.9–10.3)
Chloride: 109 mmol/L (ref 98–111)
Creatinine, Ser: 1.05 mg/dL — ABNORMAL HIGH (ref 0.44–1.00)
GFR, Estimated: 46 mL/min — ABNORMAL LOW (ref >60)
Glucose, Bld: 131 mg/dL — ABNORMAL HIGH (ref 70–99)
Potassium: 3.8 mmol/L (ref 3.5–5.1)
Sodium: 142 mmol/L (ref 135–145)

## 2020-07-06 MED ORDER — LIDOCAINE HCL (PF) 1 % IJ SOLN
INTRAMUSCULAR | Status: AC
  Administered 2020-07-06: 5 mL
  Filled 2020-07-06: qty 5

## 2020-07-06 MED ORDER — CEFTRIAXONE SODIUM 1 G IJ SOLR
1.0000 g | Freq: Once | INTRAMUSCULAR | Status: AC
  Administered 2020-07-06: 1 g via INTRAMUSCULAR
  Filled 2020-07-06: qty 10

## 2020-07-06 MED ORDER — CEPHALEXIN 500 MG PO CAPS: 500.0000 mg | ORAL_CAPSULE | Freq: Four times a day (QID) | ORAL | 0 refills | Status: AC

## 2020-07-06 MED ORDER — CEPHALEXIN 500 MG PO CAPS: 500.0000 mg | ORAL_CAPSULE | Freq: Four times a day (QID) | ORAL | 0 refills | Status: DC

## 2020-07-06 NOTE — Discharge Instructions (Addendum)
Sylvia Davila was seen in the emergency department after fall this morning.  Imaging of her head and her neck were very reassuring, and there are no broken bones or bleeding in her brain.  She does have a urinary tract infection.  She has been administered one dose of antibiotics here in the emergency department, but needs to complete a week of antibiotics at home as well.  This has been prescribed to her as listed below.  Please return to the emergency department should she develop any new or severe symptoms.

## 2020-07-06 NOTE — ED Provider Notes (Addendum)
[facility] EMERGENCY DEPARTMENT Provider Note   CSN: [id number] Arrival date & time: 07/06/20  0545     History Chief Complaint  Patient presents with  . Fall    Sylvia Davila is a 86 y.o. female who presents via [facility] from [facility] memory care unit after fall this morning from her bed onto the floor.  According to facility staff she struck her head and pulled a lamp down on top of her.  According to patient transport, the patient is at her cognitive baseline.  At time of my initial review the patient, she is sleeping peacefully in her bed.  She is arousable to verbal stimulus, smiles in response but does not open her eyes.  With tactile stimulus such as stroking her head she opens her eyes and says hello.  She intermittently responds to yes or no questions, occasionally with "I do not know".  When asked if she is hurting she responds with "no", however she winced with palpation of bilateral lower legs, and cried out as if in pain when we rolled her.  She chuckled and said "that is cold!" when stethoscope touched her skin for cardiac auscultation. I attempted to contact the patient's facility multiple times this morning to obtain collateral history, was unfortunately unsuccessful in reaching anyone.  Additionally was unable to leave a message.  I personally viewed the patient medical records.  Patient has Alzheimer's dementia; has previously established DNR upon arrival.  History of A. fib, on Xarelto according to med list from facility (active date 12/2019).  History of RA, piquantly, hypertension, hyperlipidemia, COVID-19 infection.       HPI     Past Medical History:  Diagnosis Date  . Atrial fibrillation (HCC)   . Barrett's esophagus   . Bradycardia   . COVID-19   . Dementia (HCC)   . Hyperlipidemia   . Hypertension   . Paroxysmal atrial fibrillation (HCC)   . Pulmonary emboli (HCC)   . RA (rheumatoid arthritis) (HCC)   . Respiratory  failure (HCC)   . Syncope and collapse     Patient Active Problem List   Diagnosis Date Noted  . Palliative care by specialist   . Goals of care, counseling/discussion   . Physical deconditioning   . Alzheimer's dementia with behavioral disturbance (HCC)   . Delirium   . DNR (do not resuscitate)   . Syncope 05/20/2020  . Syncope and collapse 05/19/2020    History reviewed. No pertinent surgical history.   OB History   No obstetric history on file.     No family history on file.  Social History   Tobacco Use  . Smoking status: Never Smoker  . Smokeless tobacco: Never Used  Substance Use Topics  . Alcohol use: Never  . Drug use: Never    Home Medications Prior to Admission medications   Medication Sig Start Date End Date Taking? Authorizing Provider  acetaminophen (TYLENOL) 325 MG tablet Take 650 mg by mouth every 8 (eight) hours as needed (pain).    [provider]  alendronate (FOSAMAX) 70 MG tablet Take 70 mg by mouth every Monday.  10/16/16   [provider]  amLODipine (NORVASC) 5 MG tablet Take 5 mg by mouth daily.     [provider]  atenolol (TENORMIN) 25 MG tablet Take 1 tablet (25 mg total) by mouth daily. 06/19/20   Lanier Prude, MD  atorvastatin (LIPITOR) 20 MG tablet Take 20 mg by mouth daily.  [provider]  Calcium Carbonate 500 MG CHEW Chew 500 mg by mouth daily. Tablet    [provider]  cephALEXin (KEFLEX) 500 MG capsule Take 1 capsule (500 mg total) by mouth 4 (four) times daily for 7 days. 07/06/20 07/13/20  Tahjir Silveria, Lupe Carney R, PA-C  diclofenac Sodium (VOLTAREN) 1 % GEL Apply 1 application topically 3 (three) times daily.     [provider]  docusate sodium (COLACE CLEAR) 50 MG capsule Take 50 mg by mouth every 12 (twelve) hours as needed for mild constipation.     [provider]  famotidine-calcium carbonate-magnesium hydroxide (PEPCID COMPLETE) 10-800-165 MG chewable tablet  Chew 1 tablet by mouth every evening.     [provider]  FOLIC ACID PO Take 1 tablet by mouth daily.     [provider]  furosemide (LASIX) 20 MG tablet Take 20 mg by mouth daily.    [provider]  Lecithin 1200 MG CAPS Take 1,200 mg by mouth daily.    [provider]  LORazepam (ATIVAN) 0.5 MG tablet Take 0.5 mg by mouth 3 (three) times daily.    [provider]  magnesium hydroxide (MILK OF MAGNESIA) 400 MG/5ML suspension Take 15 mLs by mouth daily as needed (constipation).    [provider]  methotrexate (RHEUMATREX) 2.5 MG tablet Take 10 mg by mouth every Friday. Caution:Chemotherapy. Protect from light.    [provider]  nystatin (MYCOSTATIN/NYSTOP) powder Apply 1 application topically 2 (two) times daily. Apply under both breasts    [provider]  Omega-3 1000 MG CAPS Take 2,000 mg by mouth daily.     [provider]  ondansetron (ZOFRAN) 4 MG tablet Take 4 mg by mouth every 8 (eight) hours as needed for nausea or vomiting.     [provider]  pantoprazole (PROTONIX) 20 MG tablet Take 20 mg by mouth daily.    [provider]  polyethylene glycol powder (GLYCOLAX/MIRALAX) 17 GM/SCOOP powder Take 17 g by mouth See admin instructions. Mix 17 g in 4-8 oz liquid and drink daily, may also take 17 g daily as needed for constipation    [provider]  potassium chloride (KLOR-CON) 10 MEQ tablet Take 10 mEq by mouth daily.    [provider]  prochlorperazine (COMPAZINE) 10 MG tablet Take 10 mg by mouth every 8 (eight) hours as needed.  07/27/18   [provider]  PSYLLIUM PO Take 1 packet by mouth daily. Mix in 4-8 oz liquid and drink    [provider]  rivaroxaban (XARELTO) 20 MG TABS tablet Take 20 mg by mouth every evening.     [provider]  sertraline (ZOLOFT) 25 MG tablet Take 75 mg by mouth daily.    [provider]  sodium  chloride 1 g tablet Take 1 g by mouth at bedtime.     [provider]    Allergies    Meclizine and Penicillins  Review of Systems   Review of Systems  Unable to perform ROS: Dementia    Physical Exam Updated Vital Signs BP (!) 149/103   Pulse 76   Temp 97.9 F (36.6 C)   Resp 15   Ht  (1.6 m)   Wt 64.4 kg   SpO2 97%   BMI 25.15 kg/m   Physical Exam Vitals and nursing note reviewed.  HENT:     Head: Normocephalic and atraumatic.     Mouth/Throat:     Mouth:  Mucous membranes are moist.     Pharynx: No oropharyngeal exudate or posterior oropharyngeal erythema.  Eyes:     General:        Right eye: No discharge.        Left eye: No discharge.     Conjunctiva/sclera: Conjunctivae normal.     Pupils: Pupils are equal, round, and reactive to light.  Cardiovascular:     Rate and Rhythm: Normal rate and regular rhythm.     Pulses: Normal pulses.     Heart sounds: Normal heart sounds. No murmur heard.   Pulmonary:     Effort: Pulmonary effort is normal. No respiratory distress.     Breath sounds: Normal breath sounds. No wheezing or rales.  Abdominal:     General: Bowel sounds are normal. There is no distension.     Palpations: Abdomen is soft.     Tenderness: There is no abdominal tenderness.  Musculoskeletal:        General: No deformity or signs of injury.     Cervical back: Neck supple. No rigidity.     Right lower leg: No edema.     Left lower leg: Edema present.  Lymphadenopathy:     Cervical: No cervical adenopathy.  Skin:    General: Skin is warm and dry.     Capillary Refill: Capillary refill takes less than 2 seconds.     Comments: Ecchymosis of dorsal forearms bilaterally, appears chronic.  Neurological:     Mental Status: She is alert. Mental status is at baseline.     Comments: Unable to perform neurologic exam due to patient's underlying Alzheimer's dementia.  Psychiatric:        Mood and Affect: Mood normal.     ED Results /  Procedures / Treatments   Labs (all labs ordered are listed, but only abnormal results are displayed) Labs Reviewed  CBC WITH DIFFERENTIAL/PLATELET - Abnormal; Notable for the following components:      Result Value   Hemoglobin 11.5 (*)    Monocytes Absolute 1.2 (*)    All other components within normal limits  BASIC METABOLIC PANEL - Abnormal; Notable for the following components:   Glucose, Bld 131 (*)    Creatinine, Ser 1.05 (*)    GFR, Estimated 46 (*)    All other components within normal limits  URINALYSIS, ROUTINE W REFLEX MICROSCOPIC - Abnormal; Notable for the following components:   Color, Urine AMBER (*)    APPearance CLOUDY (*)    Hgb urine dipstick SMALL (*)    Protein, ur 100 (*)    Nitrite POSITIVE (*)    Leukocytes,Ua LARGE (*)    WBC, UA >50 (*)    Bacteria, UA MANY (*)    All other components within normal limits  URINE CULTURE    EKG EKG Interpretation  Date/Time:  Thursday July 06 2020 06:20:06 EDT Ventricular Rate:  73 PR Interval:    QRS Duration: 89 QT Interval:  408 QTC Calculation: 450 R Axis:   58 Text Interpretation: Sinus rhythm Left ventricular hypertrophy Artifact in lead(s) I II III aVR aVL When compared with ECG of 05/27/2020, No significant change was found Confirmed by Dione Booze ([id number]) on 07/06/2020 6:33:41 AM  Radiology DG Chest 2 View  Result Date: 07/06/2020 CLINICAL DATA:  Fall. EXAM: CHEST - 2 VIEW COMPARISON:  08/11/2019 FINDINGS: The lungs are clear without focal pneumonia, edema, pneumothorax or pleural effusion. Interstitial markings are diffusely coarsened with chronic features. Cardiopericardial silhouette is at  upper limits of normal for size. The visualized bony structures of the thorax show no acute abnormality. Telemetry leads overlie the chest. IMPRESSION: Stable.  No acute findings Electronically Signed   By: Kennith Center M.D.   On: 07/06/2020 07:56   CT Head Wo Contrast  Result Date: 07/06/2020 CLINICAL DATA:   Head and neck trauma EXAM: CT HEAD WITHOUT CONTRAST CT CERVICAL SPINE WITHOUT CONTRAST TECHNIQUE: Multidetector CT imaging of the head and cervical spine was performed following the standard protocol without intravenous contrast. Multiplanar CT image reconstructions of the cervical spine were also generated. COMPARISON:  CT brain and cervical spine, 06/29/2020, CT chest, 05/19/2020 FINDINGS: CT HEAD FINDINGS Brain: No evidence of acute infarction, hemorrhage, hydrocephalus, extra-axial collection or mass lesion/mass effect. Redemonstrated periventricular and deep white matter hypodensity with global cerebral volume loss. Vascular: No hyperdense vessel or unexpected calcification. Skull: Normal. Negative for fracture or focal lesion. Sinuses/Orbits: No acute finding. Other: None. CT CERVICAL SPINE FINDINGS Alignment: Normal. Skull base and vertebrae: No acute fracture. No primary bone lesion or focal pathologic process. Soft tissues and spinal canal: No prevertebral fluid or swelling. No visible canal hematoma. Disc levels: Severe disc degenerative disease and osteophytosis of the cervical spine with partial ankylosis of C3 through C5. Upper chest: Redemonstrated, partially imaged mixed solid and ground-glass opacity of the left upper lobe. Other: None. IMPRESSION: 1. No acute intracranial pathology. Small-vessel white matter disease and global cerebral volume loss in keeping with advanced patient age. 2. No fracture or static subluxation of the cervical spine. 3. Severe disc degenerative disease and osteophytosis of the cervical spine with partial ankylosis of C3 through C5. 4. Redemonstrated, partially imaged mixed solid and ground-glass opacity of the left upper lobe, more completely assessed on prior CT of the chest. Electronically Signed   By: Lauralyn Primes M.D.   On: 07/06/2020 08:44   CT Cervical Spine Wo Contrast  Result Date: 07/06/2020 CLINICAL DATA:  Head and neck trauma EXAM: CT HEAD WITHOUT CONTRAST  CT CERVICAL SPINE WITHOUT CONTRAST TECHNIQUE: Multidetector CT imaging of the head and cervical spine was performed following the standard protocol without intravenous contrast. Multiplanar CT image reconstructions of the cervical spine were also generated. COMPARISON:  CT brain and cervical spine, 06/29/2020, CT chest, 05/19/2020 FINDINGS: CT HEAD FINDINGS Brain: No evidence of acute infarction, hemorrhage, hydrocephalus, extra-axial collection or mass lesion/mass effect. Redemonstrated periventricular and deep white matter hypodensity with global cerebral volume loss. Vascular: No hyperdense vessel or unexpected calcification. Skull: Normal. Negative for fracture or focal lesion. Sinuses/Orbits: No acute finding. Other: None. CT CERVICAL SPINE FINDINGS Alignment: Normal. Skull base and vertebrae: No acute fracture. No primary bone lesion or focal pathologic process. Soft tissues and spinal canal: No prevertebral fluid or swelling. No visible canal hematoma. Disc levels: Severe disc degenerative disease and osteophytosis of the cervical spine with partial ankylosis of C3 through C5. Upper chest: Redemonstrated, partially imaged mixed solid and ground-glass opacity of the left upper lobe. Other: None. IMPRESSION: 1. No acute intracranial pathology. Small-vessel white matter disease and global cerebral volume loss in keeping with advanced patient age. 2. No fracture or static subluxation of the cervical spine. 3. Severe disc degenerative disease and osteophytosis of the cervical spine with partial ankylosis of C3 through C5. 4. Redemonstrated, partially imaged mixed solid and ground-glass opacity of the left upper lobe, more completely assessed on prior CT of the chest. Electronically Signed   By: Lauralyn Primes M.D.   On: 07/06/2020 08:44  Procedures Procedures (including critical care time)  Medications Ordered in ED Medications  cefTRIAXone (ROCEPHIN) injection 1 g (1 g Intramuscular Given 07/06/20 1051)    lidocaine (PF) (XYLOCAINE) 1 % injection (5 mLs  Given 07/06/20 1051)    ED Course  I have reviewed the triage vital signs and the nursing notes.  Pertinent labs & imaging results that were available during my care of the patient were reviewed by me and considered in my medical decision making (see chart for details).    MDM Rules/Calculators/A&P                         Alzheimer's Dementia patient, unable to contribute to history. Concern for acute intracranial process in this elderly patient with fall on anticoagulation (Xarelto).   Patient is afebrile, not tachycardic on intake.  Mildly hypertensive 146/54.  CBC, BMP, UA ordered  CXR pending  CT head, cervical spine ordered.  EKG with sinus rhythm, no STEMI, no significant change from prior.  Attempted to contact patient's facility via phone call, without success. Will attempt again later.    Chest x-ray negative for acute cardiopulmonary findings.   CBC mildly anemic with Hbg 11.5, previously 10.6  BMP with mild AKI with Cr 1.05, previously 0.96  Have attempted multiple times to contact patient's residential facility, without success.  We will continue to attempt to contact them while the patient is here in the emergency department.  CT head and cervical spine: without acute intracranial pathology. No fracture or static subluxation of the cervical spine, severe disc degeneration in the cervical spine.  UA is positive for small hemoglobin, nitrite positive, large leukocytes, many bacteria, hyaline casts, mucus present.  Patient with obvious urinary tract infection.  Will administer Rocephin while in the emergency department, will discharge home with Keflex.  Urine culture pending, I confirmed add-on personally with the lab staff over the phone.  I attempted to contact patient's residential facility 5 times today, via phone.  Was unsuccessful in reaching anyone, was unable to leave voicemail.   Do not feel any further  work-up is necessary in the emergency department at this time.  Patient remains comfortable, resting in her hospital bed.  Vital signs remained stable throughout the patient's stay in the emergency department; continue to remain stable at this time.  She is afebrile, normal HR. Patient is stable for discharge.  Strict return precautions provided.  I communicated personally with the patient's nurse, who has contacted patient transport.  Facility requesting paper copy of prescription, which I am happy to accommodate.    Final Clinical Impression(s) / ED Diagnoses Final diagnoses:  Fall, initial encounter  Acute cystitis with hematuria    Rx / DC Orders ED Discharge Orders         Ordered    cephALEXin (KEFLEX) 500 MG capsule  4 times daily,   Status:  Discontinued        07/06/20 1040    cephALEXin (KEFLEX) 500 MG capsule  4 times daily        07/06/20 1114           Richardine Peppers, Eugene Gavia, PA-C 07/06/20 1140    Pati Thinnes, Eugene Gavia, PA-C 07/06/20 1142    Marly Schuld, Eugene Gavia, PA-C 07/06/20 1237    Milagros Loll, MD 07/06/20 1538

## 2020-07-06 NOTE — ED Triage Notes (Signed)
The pt arrived by ptar after falling off her bed onto the floor  She struck her head and pulled a lamp on top of her.  No obvious injuries no thinners  dnr from the facility  She lives at [facility] [address] in the memory care unit  Alert on arrival  The only words she spoke  I asked her if she was cold and she  Answered ' no  No other questions answered  Both eyes open  Not tracking

## 2020-07-06 NOTE — ED Notes (Signed)
Transportation arranged for patient.

## 2020-07-06 NOTE — ED Notes (Signed)
Pt left via transport. Facility was called and informed patient is on her way back with diagnosis  of UTI. Transport was given paperwork and printed RX

## 2020-07-08 LAB — URINE CULTURE: Culture: >=100000 — AB

## 2020-10-08 ENCOUNTER — Encounter (HOSPITAL_COMMUNITY): Payer: Self-pay | Admitting: Emergency Medicine

## 2020-10-08 ENCOUNTER — Emergency Department (HOSPITAL_COMMUNITY)
Admission: EM | Admit: 2020-10-08 | Discharge: 2020-10-08 | Disposition: A | Discharge status: Home / Self Care | Payer: Medicare Other | Attending: Emergency Medicine | Admitting: Emergency Medicine

## 2020-10-08 ENCOUNTER — Other Ambulatory Visit: Payer: Self-pay

## 2020-10-08 DIAGNOSIS — I10 Essential (primary) hypertension: Secondary | ICD-10-CM | POA: Insufficient documentation

## 2020-10-08 DIAGNOSIS — F0390 Unspecified dementia without behavioral disturbance: Secondary | ICD-10-CM | POA: Insufficient documentation

## 2020-10-08 DIAGNOSIS — Z8616 Personal history of COVID-19: Secondary | ICD-10-CM | POA: Diagnosis not present

## 2020-10-08 DIAGNOSIS — T148XXA Other injury of unspecified body region, initial encounter: Secondary | ICD-10-CM

## 2020-10-08 DIAGNOSIS — Z79899 Other long term (current) drug therapy: Secondary | ICD-10-CM | POA: Insufficient documentation

## 2020-10-08 DIAGNOSIS — S0181XA Laceration without foreign body of other part of head, initial encounter: Secondary | ICD-10-CM | POA: Insufficient documentation

## 2020-10-08 DIAGNOSIS — Z7901 Long term (current) use of anticoagulants: Secondary | ICD-10-CM | POA: Diagnosis not present

## 2020-10-08 DIAGNOSIS — S0990XA Unspecified injury of head, initial encounter: Secondary | ICD-10-CM | POA: Diagnosis present

## 2020-10-08 DIAGNOSIS — W228XXA Striking against or struck by other objects, initial encounter: Secondary | ICD-10-CM | POA: Insufficient documentation

## 2020-10-08 MED ORDER — LIDOCAINE-EPINEPHRINE 2 %-1:100000 IJ SOLN
30.0000 mL | Freq: Once | INTRAMUSCULAR | Status: AC
  Administered 2020-10-08: 30 mL via INTRADERMAL
  Filled 2020-10-08: qty 2

## 2020-10-08 NOTE — ED Notes (Signed)
Report called to [facility].   

## 2020-10-08 NOTE — ED Notes (Signed)
Dressing applied to forehead. Pt cleaned of blood

## 2020-10-08 NOTE — ED Notes (Signed)
Attempted to call report; left a message with a call back number on [facility]'s answering service.

## 2020-10-08 NOTE — ED Notes (Signed)
PTAR called for transport.  

## 2020-10-08 NOTE — ED Provider Notes (Signed)
[facility]-EMERGENCY DEPT Provider Note   CSN: [id number] Arrival date & time: 10/08/20  1647     History No chief complaint on file.   Sylvia Davila is a 87 y.o. female.  Pt presents to the ED today with a bleeding wound.  Pt has severe dementia and is unable to give me any hx.  Per EMS, pt fell a month ago and sustained a hematoma to her forehead.  Today, it burst and was bleeding so she was sent in.  No new fall.        Past Medical History:  Diagnosis Date  . Atrial fibrillation (HCC)   . Barrett's esophagus   . Bradycardia   . COVID-19   . Dementia (HCC)   . Hyperlipidemia   . Hypertension   . Paroxysmal atrial fibrillation (HCC)   . Pulmonary emboli (HCC)   . RA (rheumatoid arthritis) (HCC)   . Respiratory failure (HCC)   . Syncope and collapse     Patient Active Problem List   Diagnosis Date Noted  . Palliative care by specialist   . Goals of care, counseling/discussion   . Physical deconditioning   . Alzheimer's dementia with behavioral disturbance (HCC)   . Delirium   . DNR (do not resuscitate)   . Syncope 05/20/2020  . Syncope and collapse 05/19/2020    History reviewed. No pertinent surgical history.   OB History   No obstetric history on file.     History reviewed. No pertinent family history.  Social History   Tobacco Use  . Smoking status: Never Smoker  . Smokeless tobacco: Never Used  Substance Use Topics  . Alcohol use: Never  . Drug use: Never    Home Medications Prior to Admission medications   Medication Sig Start Date End Date Taking? Authorizing Provider  acetaminophen (TYLENOL) 325 MG tablet Take 650 mg by mouth every 8 (eight) hours as needed (pain).    [provider]  alendronate (FOSAMAX) 70 MG tablet Take 70 mg by mouth every Monday.  10/16/16   [provider]  amLODipine (NORVASC) 5 MG tablet Take 5 mg by mouth daily.     [provider]  atenolol (TENORMIN) 25 MG  tablet Take 1 tablet (25 mg total) by mouth daily. 06/19/20   Lanier Prude, MD  atorvastatin (LIPITOR) 20 MG tablet Take 20 mg by mouth daily.    [provider]  Calcium Carbonate 500 MG CHEW Chew 500 mg by mouth daily. Tablet    [provider]  diclofenac Sodium (VOLTAREN) 1 % GEL Apply 1 application topically 3 (three) times daily.     [provider]  docusate sodium (COLACE CLEAR) 50 MG capsule Take 50 mg by mouth every 12 (twelve) hours as needed for mild constipation.     [provider]  famotidine-calcium carbonate-magnesium hydroxide (PEPCID COMPLETE) 10-800-165 MG chewable tablet Chew 1 tablet by mouth every evening.     [provider]  FOLIC ACID PO Take 1 tablet by mouth daily.     [provider]  furosemide (LASIX) 20 MG tablet Take 20 mg by mouth daily.    [provider]  Lecithin 1200 MG CAPS Take 1,200 mg by mouth daily.    [provider]  LORazepam (ATIVAN) 0.5 MG tablet Take 0.5 mg by mouth 3 (three) times daily.    [provider]  magnesium hydroxide (MILK OF MAGNESIA) 400 MG/5ML suspension Take 15 mLs by mouth daily as  needed (constipation).    [provider]  methotrexate (RHEUMATREX) 2.5 MG tablet Take 10 mg by mouth every Friday. Caution:Chemotherapy. Protect from light.    [provider]  nystatin (MYCOSTATIN/NYSTOP) powder Apply 1 application topically 2 (two) times daily. Apply under both breasts    [provider]  Omega-3 1000 MG CAPS Take 2,000 mg by mouth daily.     [provider]  ondansetron (ZOFRAN) 4 MG tablet Take 4 mg by mouth every 8 (eight) hours as needed for nausea or vomiting.     [provider]  pantoprazole (PROTONIX) 20 MG tablet Take 20 mg by mouth daily.    [provider]  polyethylene glycol powder (GLYCOLAX/MIRALAX) 17 GM/SCOOP powder Take 17 g by mouth See admin instructions. Mix 17 g in 4-8 oz liquid  and drink daily, may also take 17 g daily as needed for constipation    [provider]  potassium chloride (KLOR-CON) 10 MEQ tablet Take 10 mEq by mouth daily.    [provider]  prochlorperazine (COMPAZINE) 10 MG tablet Take 10 mg by mouth every 8 (eight) hours as needed.  07/27/18   [provider]  PSYLLIUM PO Take 1 packet by mouth daily. Mix in 4-8 oz liquid and drink    [provider]  rivaroxaban (XARELTO) 20 MG TABS tablet Take 20 mg by mouth every evening.     [provider]  sertraline (ZOLOFT) 25 MG tablet Take 75 mg by mouth daily.    [provider]  sodium chloride 1 g tablet Take 1 g by mouth at bedtime.     [provider]    Allergies    Meclizine and Penicillins  Review of Systems   Review of Systems  Unable to perform ROS: Dementia  Skin: Positive for wound.  All other systems reviewed and are negative.   Physical Exam Updated Vital Signs BP (!) 152/127   Pulse (!) 57   Resp 19   Ht 5\' 3"  (1.6 m)   Wt 64.4 kg   SpO2 100%   BMI 25.15 kg/m   Physical Exam Vitals and nursing note reviewed.  HENT:     Head:     Comments: Large hematoma to right forehead which has eroded through the skin.  Arteriole bleeding.    Right Ear: External ear normal.     Left Ear: External ear normal.     Nose: Nose normal.     Mouth/Throat:     Mouth: Mucous membranes are moist.  Eyes:     Extraocular Movements: Extraocular movements intact.     Conjunctiva/sclera: Conjunctivae normal.     Pupils: Pupils are equal, round, and reactive to light.  Cardiovascular:     Rate and Rhythm: Normal rate and regular rhythm.     Pulses: Normal pulses.     Heart sounds: Normal heart sounds.  Pulmonary:     Effort: Pulmonary effort is normal.     Breath sounds: Normal breath sounds.  Abdominal:     General: Abdomen is flat. Bowel sounds are normal.     Palpations: Abdomen is soft.  Musculoskeletal:     Cervical back:  Normal range of motion and neck supple.  Skin:    General: Skin is warm.     Capillary Refill: Capillary refill takes less than 2 seconds.  Neurological:     Mental Status: She is alert. Mental status is at baseline.     ED Results / Procedures /  Treatments   Labs (all labs ordered are listed, but only abnormal results are displayed) Labs Reviewed - No data to display  EKG None  Radiology No results found.  Procedures .Marland KitchenLaceration Repair  Date/Time: 10/08/2020 5:32 PM Performed by: Jacalyn Lefevre, MD Authorized by: Jacalyn Lefevre, MD   Consent:    Consent obtained:  Emergent situation   Risks, benefits, and alternatives were discussed: not applicable   Universal protocol:    Patient identity confirmed:  Arm band Anesthesia:    Anesthesia method:  Local infiltration   Local anesthetic:  Lidocaine 2% WITH epi Laceration details:    Location:  Face   Face location:  Forehead   Length (cm):  2 Pre-procedure details:    Preparation:  Patient was prepped and draped in usual sterile fashion Exploration:    Hemostasis achieved with:  Epinephrine, cautery and direct pressure   Contaminated: no   Treatment:    Area cleansed with:  Povidone-iodine   Amount of cleaning:  Standard   Irrigation solution:  Sterile saline   Debridement:  None Skin repair:    Repair method:  Sutures   Suture size:  4-0   Suture material:  Prolene   Suture technique:  Simple interrupted   Number of sutures:  4 Approximation:    Approximation:  Close Repair type:    Repair type:  Intermediate Post-procedure details:    Dressing:  Bulky dressing   Procedure completion:  Tolerated well, no immediate complications   (including critical care time)  Medications Ordered in ED Medications  lidocaine-EPINEPHrine (XYLOCAINE W/EPI) 2 %-1:100000 (with pres) injection 30 mL (30 mLs Intradermal Given by Other 10/08/20 1722)    ED Course  I have reviewed the triage vital signs and the nursing  notes.  Pertinent labs & imaging results that were available during my care of the patient were reviewed by me and considered in my medical decision making (see chart for details).    MDM Rules/Calculators/A&P                          Bleeding unable to be controlled with cautery.  Therefore, pt was sutured and bleeding is now controlled.  Pt is on Xarelto, but fall occurred a month ago, so no CT indicated.  I called pt's daughter and left a message.  Pt is stable for d/c back to the SNF.  Sutures to be removed in 5 to 7 days.  Hold Xarelto tonight.  Final Clinical Impression(s) / ED Diagnoses Final diagnoses:  Laceration of forehead, initial encounter  Hematoma    Rx / DC Orders ED Discharge Orders    None       Jacalyn Lefevre, MD 10/08/20 1735

## 2020-10-08 NOTE — ED Triage Notes (Signed)
Pt arrived via EMS from [facility]. Pt is a memory care pt, she is a hospice pt. Pt fell about a month ago and hit her head. Pt was not sent out at time of fall. Pt had a hematoma from the fall a month ago, and today it burst. Pt is A&O x 1 at best per EMS: she occasionally knows her name. Pt is on a blood thinner. Pt's head was wrapped by the facility.

## 2020-10-10 ENCOUNTER — Encounter (HOSPITAL_COMMUNITY): Payer: Self-pay | Admitting: Surgery

## 2020-10-10 ENCOUNTER — Emergency Department (HOSPITAL_COMMUNITY)

## 2020-10-10 ENCOUNTER — Other Ambulatory Visit: Payer: Self-pay

## 2020-10-10 ENCOUNTER — Inpatient Hospital Stay (HOSPITAL_COMMUNITY)
Admission: EM | Admit: 2020-10-10 | Discharge: 2020-10-24 | DRG: 812 | Disposition: E | Source: Skilled Nursing Facility | Attending: Internal Medicine | Admitting: Internal Medicine

## 2020-10-10 DIAGNOSIS — Z8673 Personal history of transient ischemic attack (TIA), and cerebral infarction without residual deficits: Secondary | ICD-10-CM | POA: Diagnosis not present

## 2020-10-10 DIAGNOSIS — Z79899 Other long term (current) drug therapy: Secondary | ICD-10-CM | POA: Diagnosis not present

## 2020-10-10 DIAGNOSIS — R7989 Other specified abnormal findings of blood chemistry: Secondary | ICD-10-CM | POA: Diagnosis not present

## 2020-10-10 DIAGNOSIS — W1830XA Fall on same level, unspecified, initial encounter: Secondary | ICD-10-CM | POA: Diagnosis present

## 2020-10-10 DIAGNOSIS — G309 Alzheimer's disease, unspecified: Secondary | ICD-10-CM | POA: Diagnosis present

## 2020-10-10 DIAGNOSIS — T45515A Adverse effect of anticoagulants, initial encounter: Secondary | ICD-10-CM | POA: Diagnosis present

## 2020-10-10 DIAGNOSIS — F419 Anxiety disorder, unspecified: Secondary | ICD-10-CM | POA: Diagnosis present

## 2020-10-10 DIAGNOSIS — R296 Repeated falls: Secondary | ICD-10-CM | POA: Diagnosis present

## 2020-10-10 DIAGNOSIS — E440 Moderate protein-calorie malnutrition: Secondary | ICD-10-CM | POA: Diagnosis present

## 2020-10-10 DIAGNOSIS — R54 Age-related physical debility: Secondary | ICD-10-CM | POA: Diagnosis present

## 2020-10-10 DIAGNOSIS — F32A Depression, unspecified: Secondary | ICD-10-CM | POA: Diagnosis present

## 2020-10-10 DIAGNOSIS — D509 Iron deficiency anemia, unspecified: Secondary | ICD-10-CM | POA: Diagnosis present

## 2020-10-10 DIAGNOSIS — Z20822 Contact with and (suspected) exposure to covid-19: Secondary | ICD-10-CM | POA: Diagnosis present

## 2020-10-10 DIAGNOSIS — N179 Acute kidney failure, unspecified: Secondary | ICD-10-CM | POA: Diagnosis present

## 2020-10-10 DIAGNOSIS — F0280 Dementia in other diseases classified elsewhere without behavioral disturbance: Secondary | ICD-10-CM | POA: Diagnosis present

## 2020-10-10 DIAGNOSIS — Z66 Do not resuscitate: Secondary | ICD-10-CM | POA: Diagnosis present

## 2020-10-10 DIAGNOSIS — I468 Cardiac arrest due to other underlying condition: Secondary | ICD-10-CM | POA: Diagnosis present

## 2020-10-10 DIAGNOSIS — R791 Abnormal coagulation profile: Secondary | ICD-10-CM | POA: Diagnosis present

## 2020-10-10 DIAGNOSIS — D62 Acute posthemorrhagic anemia: Secondary | ICD-10-CM | POA: Diagnosis present

## 2020-10-10 DIAGNOSIS — Z515 Encounter for palliative care: Secondary | ICD-10-CM

## 2020-10-10 DIAGNOSIS — I480 Paroxysmal atrial fibrillation: Secondary | ICD-10-CM | POA: Diagnosis present

## 2020-10-10 DIAGNOSIS — I82409 Acute embolism and thrombosis of unspecified deep veins of unspecified lower extremity: Secondary | ICD-10-CM | POA: Diagnosis present

## 2020-10-10 DIAGNOSIS — F0281 Dementia in other diseases classified elsewhere with behavioral disturbance: Secondary | ICD-10-CM | POA: Diagnosis present

## 2020-10-10 DIAGNOSIS — R578 Other shock: Secondary | ICD-10-CM | POA: Diagnosis present

## 2020-10-10 DIAGNOSIS — S0003XA Contusion of scalp, initial encounter: Secondary | ICD-10-CM | POA: Diagnosis present

## 2020-10-10 DIAGNOSIS — I10 Essential (primary) hypertension: Secondary | ICD-10-CM | POA: Diagnosis present

## 2020-10-10 DIAGNOSIS — R001 Bradycardia, unspecified: Secondary | ICD-10-CM | POA: Diagnosis present

## 2020-10-10 DIAGNOSIS — I4891 Unspecified atrial fibrillation: Secondary | ICD-10-CM

## 2020-10-10 DIAGNOSIS — S0003XS Contusion of scalp, sequela: Secondary | ICD-10-CM | POA: Diagnosis not present

## 2020-10-10 DIAGNOSIS — D649 Anemia, unspecified: Secondary | ICD-10-CM | POA: Diagnosis not present

## 2020-10-10 DIAGNOSIS — Z86718 Personal history of other venous thrombosis and embolism: Secondary | ICD-10-CM | POA: Diagnosis not present

## 2020-10-10 DIAGNOSIS — R627 Adult failure to thrive: Secondary | ICD-10-CM | POA: Diagnosis present

## 2020-10-10 DIAGNOSIS — D6832 Hemorrhagic disorder due to extrinsic circulating anticoagulants: Secondary | ICD-10-CM | POA: Diagnosis present

## 2020-10-10 DIAGNOSIS — T148XXA Other injury of unspecified body region, initial encounter: Secondary | ICD-10-CM

## 2020-10-10 DIAGNOSIS — I2699 Other pulmonary embolism without acute cor pulmonale: Secondary | ICD-10-CM | POA: Diagnosis present

## 2020-10-10 DIAGNOSIS — Z9181 History of falling: Secondary | ICD-10-CM

## 2020-10-10 DIAGNOSIS — J9811 Atelectasis: Secondary | ICD-10-CM | POA: Diagnosis present

## 2020-10-10 DIAGNOSIS — Z7189 Other specified counseling: Secondary | ICD-10-CM | POA: Diagnosis not present

## 2020-10-10 DIAGNOSIS — Z86711 Personal history of pulmonary embolism: Secondary | ICD-10-CM | POA: Diagnosis not present

## 2020-10-10 DIAGNOSIS — I639 Cerebral infarction, unspecified: Secondary | ICD-10-CM | POA: Diagnosis present

## 2020-10-10 DIAGNOSIS — Z6827 Body mass index (BMI) 27.0-27.9, adult: Secondary | ICD-10-CM

## 2020-10-10 DIAGNOSIS — I469 Cardiac arrest, cause unspecified: Secondary | ICD-10-CM | POA: Diagnosis present

## 2020-10-10 HISTORY — DX: Barrett's esophagus without dysplasia: K22.70

## 2020-10-10 HISTORY — DX: Other pulmonary embolism without acute cor pulmonale: I26.99

## 2020-10-10 HISTORY — DX: Unspecified dementia without behavioral disturbance: F03.90

## 2020-10-10 HISTORY — DX: Unspecified atrial fibrillation: I48.91

## 2020-10-10 HISTORY — DX: Rheumatoid arthritis, unspecified: M06.9

## 2020-10-10 HISTORY — DX: Hyperlipidemia, unspecified: E78.5

## 2020-10-10 HISTORY — DX: Essential (primary) hypertension: I10

## 2020-10-10 LAB — CBC WITH DIFFERENTIAL/PLATELET
Abs Immature Granulocytes: 0.25 10*3/uL — ABNORMAL HIGH (ref 0.00–0.07)
Basophils Absolute: 0 10*3/uL (ref 0.0–0.1)
Basophils Relative: 0 %
Eosinophils Absolute: 0.1 10*3/uL (ref 0.0–0.5)
Eosinophils Relative: 1 %
HCT: 24.9 % — ABNORMAL LOW (ref 36.0–46.0)
Hemoglobin: 7.3 g/dL — ABNORMAL LOW (ref 12.0–15.0)
Immature Granulocytes: 2 %
Lymphocytes Relative: 25 %
Lymphs Abs: 2.9 10*3/uL (ref 0.7–4.0)
MCH: 28 pg (ref 26.0–34.0)
MCHC: 29.3 g/dL — ABNORMAL LOW (ref 30.0–36.0)
MCV: 95.4 fL (ref 80.0–100.0)
Monocytes Absolute: 1.1 10*3/uL — ABNORMAL HIGH (ref 0.1–1.0)
Monocytes Relative: 9 %
Neutro Abs: 7.3 10*3/uL (ref 1.7–7.7)
Neutrophils Relative %: 63 %
Platelets: 302 10*3/uL (ref 150–400)
RBC: 2.61 MIL/uL — ABNORMAL LOW (ref 3.87–5.11)
RDW: 17.6 % — ABNORMAL HIGH (ref 11.5–15.5)
WBC: 11.8 10*3/uL — ABNORMAL HIGH (ref 4.0–10.5)
nRBC: 0.2 % (ref 0.0–0.2)

## 2020-10-10 LAB — COMPREHENSIVE METABOLIC PANEL
ALT: 17 U/L (ref 0–44)
AST: 18 U/L (ref 15–41)
Albumin: 2.3 g/dL — ABNORMAL LOW (ref 3.5–5.0)
Alkaline Phosphatase: 75 U/L (ref 38–126)
Anion gap: 11 (ref 5–15)
BUN: 27 mg/dL — ABNORMAL HIGH (ref 8–23)
CO2: 24 mmol/L (ref 22–32)
Calcium: 8.4 mg/dL — ABNORMAL LOW (ref 8.9–10.3)
Chloride: 109 mmol/L (ref 98–111)
Creatinine, Ser: 1.48 mg/dL — ABNORMAL HIGH (ref 0.44–1.00)
GFR, Estimated: 33 mL/min — ABNORMAL LOW (ref >60)
Glucose, Bld: 192 mg/dL — ABNORMAL HIGH (ref 70–99)
Potassium: 3.5 mmol/L (ref 3.5–5.1)
Sodium: 144 mmol/L (ref 135–145)
Total Bilirubin: 0.7 mg/dL (ref 0.3–1.2)
Total Protein: 5.4 g/dL — ABNORMAL LOW (ref 6.5–8.1)

## 2020-10-10 LAB — BLOOD PRODUCT ORDER (VERBAL) VERIFICATION

## 2020-10-10 LAB — PROTIME-INR
INR: 3.3 — ABNORMAL HIGH (ref 0.8–1.2)
Prothrombin Time: 32.8 seconds — ABNORMAL HIGH (ref 11.4–15.2)

## 2020-10-10 LAB — ABO/RH: ABO/RH(D): A POS

## 2020-10-10 LAB — RETICULOCYTES
Immature Retic Fract: 25.7 % — ABNORMAL HIGH (ref 2.3–15.9)
RBC.: 3.45 MIL/uL — ABNORMAL LOW (ref 3.87–5.11)
Retic Count, Absolute: 71.1 10*3/uL (ref 19.0–186.0)
Retic Ct Pct: 2.1 % (ref 0.4–3.1)

## 2020-10-10 LAB — APTT: aPTT: 40 seconds — ABNORMAL HIGH (ref 24–36)

## 2020-10-10 LAB — HEPARIN LEVEL (UNFRACTIONATED): Heparin Unfractionated: >2.2 IU/mL — ABNORMAL HIGH (ref 0.30–0.70)

## 2020-10-10 LAB — RESP PANEL BY RT-PCR (FLU A&B, COVID) ARPGX2
Influenza A by PCR: NEGATIVE
Influenza B by PCR: NEGATIVE
SARS Coronavirus 2 by RT PCR: NEGATIVE

## 2020-10-10 LAB — HEMOGLOBIN AND HEMATOCRIT, BLOOD
HCT: 30.6 % — ABNORMAL LOW (ref 36.0–46.0)
Hemoglobin: 10.1 g/dL — ABNORMAL LOW (ref 12.0–15.0)

## 2020-10-10 MED ORDER — PROCHLORPERAZINE MALEATE 10 MG PO TABS
10.0000 mg | ORAL_TABLET | Freq: Three times a day (TID) | ORAL | Status: DC | PRN
  Filled 2020-10-10: qty 1

## 2020-10-10 MED ORDER — SODIUM CHLORIDE 1 G PO TABS
1.0000 g | ORAL_TABLET | Freq: Every day | ORAL | Status: DC
  Administered 2020-10-11 – 2020-10-12 (×2): 1 g via ORAL
  Filled 2020-10-10 (×6): qty 1

## 2020-10-10 MED ORDER — BACITRACIN ZINC 500 UNIT/GM EX OINT
TOPICAL_OINTMENT | Freq: Two times a day (BID) | CUTANEOUS | Status: DC
  Administered 2020-10-11: 1 via TOPICAL
  Filled 2020-10-10: qty 28.4

## 2020-10-10 MED ORDER — SODIUM CHLORIDE 0.9 % IV BOLUS
1000.0000 mL | Freq: Once | INTRAVENOUS | Status: AC
  Administered 2020-10-10: 1000 mL via INTRAVENOUS

## 2020-10-10 MED ORDER — TRAMADOL HCL 50 MG PO TABS
50.0000 mg | ORAL_TABLET | Freq: Four times a day (QID) | ORAL | Status: DC | PRN
  Administered 2020-10-11 – 2020-10-12 (×4): 50 mg via ORAL
  Filled 2020-10-10 (×4): qty 1

## 2020-10-10 MED ORDER — ACETAMINOPHEN 650 MG RE SUPP: 650.0000 mg | Freq: Four times a day (QID) | RECTAL | Status: DC | PRN

## 2020-10-10 MED ORDER — PROTHROMBIN COMPLEX CONC HUMAN 500 UNITS IV KIT
50.0000 [IU]/kg | PACK | Status: AC
  Administered 2020-10-10: 3220 [IU] via INTRAVENOUS
  Filled 2020-10-10: qty 3220

## 2020-10-10 MED ORDER — LORAZEPAM 0.5 MG PO TABS
0.5000 mg | ORAL_TABLET | Freq: Three times a day (TID) | ORAL | Status: DC
  Administered 2020-10-10 – 2020-10-13 (×9): 0.5 mg via ORAL
  Filled 2020-10-10 (×10): qty 1

## 2020-10-10 MED ORDER — FOLIC ACID 1 MG PO TABS
1.0000 mg | ORAL_TABLET | Freq: Every day | ORAL | Status: DC
  Administered 2020-10-10 – 2020-10-13 (×4): 1 mg via ORAL
  Filled 2020-10-10 (×4): qty 1

## 2020-10-10 MED ORDER — ATENOLOL 25 MG PO TABS
25.0000 mg | ORAL_TABLET | Freq: Every day | ORAL | Status: DC
Start: 2020-10-10 — End: 2020-10-12
  Administered 2020-10-10 – 2020-10-11 (×2): 25 mg via ORAL
  Filled 2020-10-10 (×2): qty 1

## 2020-10-10 MED ORDER — SERTRALINE HCL 25 MG PO TABS
75.0000 mg | ORAL_TABLET | Freq: Every day | ORAL | Status: DC
  Administered 2020-10-10 – 2020-10-13 (×4): 75 mg via ORAL
  Filled 2020-10-10 (×4): qty 1

## 2020-10-10 MED ORDER — LACTATED RINGERS IV SOLN: INTRAVENOUS | Status: DC

## 2020-10-10 MED ORDER — PANTOPRAZOLE SODIUM 20 MG PO TBEC
20.0000 mg | DELAYED_RELEASE_TABLET | Freq: Every day | ORAL | Status: DC
  Administered 2020-10-10 – 2020-10-13 (×4): 20 mg via ORAL
  Filled 2020-10-10 (×4): qty 1

## 2020-10-10 MED ORDER — ACETAMINOPHEN 325 MG PO TABS
650.0000 mg | ORAL_TABLET | Freq: Four times a day (QID) | ORAL | Status: DC | PRN
  Administered 2020-10-10 – 2020-10-11 (×2): 650 mg via ORAL
  Filled 2020-10-10 (×2): qty 2

## 2020-10-10 MED ORDER — POLYETHYLENE GLYCOL 3350 17 G PO PACK
17.0000 g | PACK | Freq: Every day | ORAL | Status: DC
  Administered 2020-10-10 – 2020-10-13 (×4): 17 g via ORAL
  Filled 2020-10-10 (×4): qty 1

## 2020-10-10 MED ORDER — ATORVASTATIN CALCIUM 10 MG PO TABS
20.0000 mg | ORAL_TABLET | Freq: Every day | ORAL | Status: DC
  Administered 2020-10-10 – 2020-10-13 (×4): 20 mg via ORAL
  Filled 2020-10-10 (×4): qty 2

## 2020-10-10 NOTE — Progress Notes (Signed)
PROGRESS NOTE  Sylvia Davila  DOB: 1927/08/24  PCP: Patient, No Pcp Per NWG:[id number]  DOA: October 19, 2020  LOS: 0 days   Chief Complaint  Patient presents with  . Fall  . Cardiac Arrest   Brief narrative: Sylvia Davila is a 87 y.o. female with PMH significant for paroxysmal atrial fibrillation on long-term Xarelto, hx of stroke, frequent falls, hypertension, dementia who lives at [facility]. 1/17, patient had a ground-level mechanical fall and was brought to ED.  She was found to have superficial bleeding from a hematoma on the right side of the forehead.  It was sutured and patient was discharged back to SNF from the ED.  Later in the night, patient was found to have bleeding from ruptured hematoma and hence EMS was called. In route to ED, patient had a cardiac arrest.  ROSC obtained after 2 minutes of CPR.  On arrival to the ED, patient was awake but had a low blood pressure of 82/40 with heart rate in 100s. Labs showed a hemoglobin of 7.3, creatinine elevated 1.48. INR 3.3 on Xarelto. Chest x-ray showed bibasilar opacities consistent with atelectasis. CTA did not show any acute intracranial process but showed soft tissue edema in the right frontal skull. In the ED, patient was also seen by trauma surgery Dr. Freida Busman.  Previous suture removed and wound was sutured again. Admitted to hospitalist service. Trauma surgery to follow as consult.  Discussed with patient's daughter Ms. [name] noticed this afternoon. Agreeing to the daughter, patient had a stroke.  Ago after which she was on Eliquis.  It was later stopped because of GI bleeding. But she ended up having PE in [facility] 2020. Since that hospital stay, she was started on [facility] and sent to SNF.  Over the months, she declined physically and mentally. Since April 2021, she is in a memory care unit. For a month now, she has been under hospice care under [facility].   Subjective: Patient was seen and examined this morning in the  ED. Pleasant elderly Caucasian female.  Sleeping.  Demented.  Not restless or agitated but completely disoriented.  Linens soaked with previous bleeding from the forehead hematoma. Blood pressure hass improved this morning.  Assessment/Plan: Ruptured hematoma on right forehead On long-term anticoagulation -Reportedly had repeated falls since October while on long-term anticoagulation. -Presented this admission for bleeding from ruptured hematoma on the right forehead. -Sutured by trauma surgery.  Hemostasis achieved. -Xarelto on hold.  Kcentra given for an elevated INR.  Acute on chronic anemia -Baseline hemoglobin 9-10. -Acute drop in hemoglobin secondary to scalp hematoma bleeding -Hemoglobin low at 7.3 this morning.  -1 unit PRBC transfused. -Xarelto on hold.  Kcentra given. Recent Labs    October 19, 2020 0218 10-19-20 0858  HGB 7.3* 10.1*  MCV 95.4  --   RETICCTPCT  --  2.1   Essential hypertension. -Patient was hypotensive on arrival.  Blood pressure improved with IV fluid.  Currently blood pressure in normal range.   -Home meds include atenolol 25 mg daily, amlodipine 5 mg daily, Lasix 40 mg daily, -Resume atenolol today.  Keep amlodipine and Lasix on hold.  Paroxysmal A. Fib -Resume atenolol.  I will keep Xarelto on hold.  Probably never resume it.  Need to be discussed with family.  Hx of stroke Hx of DVT/PE  Elevated creatinine -Creatinine elevated to 1.48.  Unclear baseline.  I think patient's chart is being merged to her old chart after which previous lab results may be accessible. Recent Labs  09/23/2020 0218  BUN 27*  CREATININE 1.48*   Dementia -Supportive care. -Resume Ativan 0.5 mg 3 times daily, sertraline 75 mg daily, tramadol 50 mg every 6 as needed. -Resume all.  Mobility: PT eval Code Status:   Code Status: DNR  Nutritional status: Body mass index is 25.97 kg/m.     Diet Order            DIET DYS 3 Room service appropriate? No; Fluid  consistency: Thin  Diet effective now                 DVT prophylaxis: SCDs Start: 10/07/2020 0508   Antimicrobials:  None Fluid: Not needed Consultants: Trauma surgery Family Communication:  called and updated patient's daughter today.  Status is: Inpatient  Remains inpatient appropriate because: Monitoring of her bleeding   Dispo: The patient is from: SNF              Anticipated d/c is to: SNF              Anticipated d/c date is: 2 days              Patient currently is not medically stable to d/c.    Infusions:    Scheduled Meds: . atenolol  25 mg Oral Daily  . atorvastatin  20 mg Oral Daily  . folic acid  1 mg Oral Daily  . LORazepam  0.5 mg Oral TID  . pantoprazole  20 mg Oral Daily  . polyethylene glycol  17 g Oral Daily  . sertraline  75 mg Oral Daily  . sodium chloride  1 g Oral QHS    Antimicrobials: Anti-infectives (From admission, onward)   None      PRN meds: acetaminophen **OR** acetaminophen, prochlorperazine, traMADol   Objective: Vitals:   10/04/2020 1312 10/21/2020 1337  BP:  138/84  Pulse:  72  Resp:  16  Temp: 97.6 F (36.4 C) 98.2 F (36.8 C)  SpO2:  100%   No intake or output data in the 24 hours ending 10/12/2020 1405 Filed Weights   10/16/2020 0220 10/19/2020 0324  Weight: 64.4 kg 64.4 kg   Weight change:  Body mass index is 25.97 kg/m.   Physical Exam: General exam: Elderly Caucasian female.  Not in distress Skin: No rashes, lesions or ulcers. HEENT: Right scalp hematoma with a bandage on.  Old stroke is present  Lungs: Clear to auscultation bilaterally CVS: Regular rate and rhythm, no murmur GI/Abd soft, nontender, nondistended, bowel sound present CNS: Alert, awake, not oriented to place, person or time Psychiatry: Depressed look Extremities: No pedal edema, no calf tenderness  Data Review: I have personally reviewed the laboratory data and studies available.  Recent Labs  Lab 10/02/2020 0218 09/28/2020 0858  WBC 11.8*   --   NEUTROABS 7.3  --   HGB 7.3* 10.1*  HCT 24.9* 30.6*  MCV 95.4  --   PLT 302  --    Recent Labs  Lab 10/21/2020 0218  NA 144  K 3.5  CL 109  CO2 24  GLUCOSE 192*  BUN 27*  CREATININE 1.48*  CALCIUM 8.4*    F/u labs ordered  Signed, Lorin Glass, MD [facility] 10/02/2020

## 2020-10-10 NOTE — Procedures (Signed)
Right scalp hematoma examined and noted to have active arterial bleeding. Previously placed sutures were removed and the wound was explored. There was underlying fibrosed, well-organized clot consistent with an old hematoma, with active arterial bleeding from the inferior aspect. The wound was infiltrated with 1% lidocaine with epinephrine, and the bleeding slowed but persisted. Two 3-0 nylon figure-of-eight sutures were placed through the skin, taking wide bites, and tied down tightly. The wound was largely hemostatic after this. A pressure dressing was placed.

## 2020-10-10 NOTE — ED Triage Notes (Signed)
Patient from [facility], fell, was also seen in ED yesterday and had a hematoma on right side of forehead.  Patient was found at SNF bleeding from ruptured hematoma.  Patient is on Xerelto.  Patient arrested with EMS en route to ED.  ROSC obtained after 2 mins of CPR.  Patient was initially 130/90 with pulse of 80.  En route to ED, pressure dropped to 82/40 with HR in 100's.  Patient awake, at baseline upon arrival to ED.

## 2020-10-10 NOTE — H&P (Signed)
History and Physical    PLEASE NOTE THAT DRAGON DICTATION SOFTWARE WAS USED IN THE CONSTRUCTION OF THIS NOTE.   Sylvia Davila EUM:[id number] DOB: 1927-04-06 DOA: 10/20/2020  PCP: Patient, No Pcp Per Patient coming from: SNF  I have personally briefly reviewed patient's old medical records in [facility]  Chief Complaint: Bleeding from right scalp hematoma  HPI: Sylvia Davila is a 87 y.o. female with medical history significant for paroxysmal atrial fibrillation on chronic anticoagulation, hypertension, dementia, who is admitted to [facility] on 10/06/2020 with hemorrhagic shock in the setting of acute on chronic anemia after presenting from SNF to [facility] Emergency Department for evaluation of bleeding from right scalp hematoma.   In the context of the patient's dementia, the following history was provided by the patient's daughter, my discussions with the emergency department physician, and via chart review.  Patient reportedly underwent a ground-level mechanical fall in October 2021 resulting in a right scalp hematoma for which she reportedly presented to [facility] emergency department.  Hemostasis was achieved at the time, the patient was discharged back to her SNF from the emergency department.  Daughter reports that the patient experienced a ground-level mechanical fall in December 2021, which she believes was the more substantial cause of her right scalp hematoma, although chart review reveals no documentation of this second round of mechanical fall relative to documentation found relating to the trauma mechanical fall in October 2021 leading to initial right scalp hematoma.  A few days ago, staff at SNF noted there to be active bleeding from the right scalp hematoma, prompting patient to be brought to [facility] ED for further evaluation at that time.  At that time, hospital superficial active bleeding was noted relating to the right scalp hematoma, for which hemostasis was reportedly  achieved via suturing performed in the ED at that time, following which the patient was discharged back to her SNF from the ED. however, this evening, EMS was called to the SNF after additional bleeding from the right scalp hematoma was noted, brought to the patient was brought back to [facility] ED again for further evaluation, in the absence of any reported interval trauma.  Medical history is notable for paroxysmal atrial fibrillation for which the patient is reportedly anticoagulated, although outpatient med rec has not yet been completed, and review of care everywhere yields no insight into current outpatient medication list.  Aside from this anticoagulant, patient reportedly is on no additional blood thinning agents, including no aspirin as an outpatient.  Per my discussions with the emergency department physician this evening, the patient has a history of chronic anemia, with associated baseline hemoglobin of 9-10.  Medical history is also notable for essential hypertension.   The patient is DNR/DNI per documentation provided by SNF staff to [facility] personnel this evening.     ED Course:  Vital signs in the ED were notable for the following: Temperature max 97.9; initial heart rate 92, which decreased to 63 following interval administration of IV fluids PRBC transfusion, as further quantify below; initial blood pressure noted to be 80/60, which improved to 132/51 following interval IV fluids as well as PRBC transfusion; respiratory rate 19-22; oxygen saturation 9600% on room air.  Labs were notable for the following: CMP was notable for the following: Sodium 144, potassium 3.5, bicarbonate 24, BUN 27, creatinine 1.48 without prior creatinine data points currently available in our EMR.  CBC notable for white blood cell count of 11,800, hemoglobin 7.3 relative reported baseline hemoglobin range  of 9-10, with presenting hemoglobin associate with MCV of 95, MCHC 29, and RDW 17.6.  Presenting INR noted to be 3.3.   Screening nasopharyngeal COVID-19/influenza PCR performed in the ED this evening found to be negative.  Chest x-ray showed bibasilar opacities consistent with atelectasis in the absence of overt evidence of infiltrate, edema, effusion, or pneumothorax.  Noncontrast CT that showed no evidence of acute intracranial process, including no evidence of acute intracranial infarct or acute intracranial hemorrhage, but did show evidence of soft tissue hematoma over the right frontal skull.  CT cervical spine showed no evidence of acute fracture or malalignment of the cervical spine.  Case and imaging were discussed with the on-call trauma surgeon, Dr. Freida Busman, who is able to achieve hemostasis associated with the right scalp hematoma via placement of several sutures.  Trauma surgery agrees with reversal of anticoagulation, and will continue to follow.  While in the ED, the patient has received transfusion of 1 unit PRBC, and has been started on a second unit PBC, with interval improvement in blood pressure from the systolic 80s into the systolic 120s to 257D, as further described above.  In the setting of pharmacologic induced coagulopathy, with presenting INR noted to be 3.3, pharmacy was consulted for assistance with pharmacologic reversal with subsequent initiation of IV Kcentra.  In addition to the 2 units PRBC, the patient also received a 1 L normal saline bolus upon initially presenting to the ED.  Subsequently, the patient is being admitted to the PCU for further evaluation management presenting hemorrhagic shock in the setting of acute blood loss anemia superimposed on chronic anemia.    Review of Systems: As per HPI otherwise 10 point review of systems negative.   Past Medical History:  Diagnosis Date  . Atrial fibrillation (HCC)   . Barrett's esophagus   . Dementia (HCC)   . Hyperlipidemia   . Hypertension   . Pulmonary embolism (HCC)   . Rheumatoid arthritis (HCC)     History reviewed. No  pertinent surgical history.  Social History:  reports that she has never smoked. She does not have any smokeless tobacco history on file. No history on file for alcohol use and drug use.   No Known Allergies  Family history reviewed and not pertinent    Prior to Admission medications   Not on File  (Inpatient pharmacy consult has been placed for assistance with accurate reconciliation of outpatient med list).    Objective    Physical Exam: Vitals:   10/22/2020 0230 10/06/2020 0320 09/29/2020 0324 10/21/2020 0400  BP: (!) 128/50 (!) 99/50  (!) 132/51  Pulse: 73 73  63  Resp: 19 (!) 25  11  Temp:      TempSrc:      SpO2: 96% 100%  98%  Weight:   64.4 kg   Height:   5\' 2"  (1.575 m)     General: appears to be stated age; somnolent, will briefly open eyes to verbal stimuli. Skin: warm, dry; right scalp hematoma noted with current pneumostasis and associated sutures noted. Head:  AT/[place] Mouth:  Oral mucosa membranes appear dry, normal dentition Neck: supple; trachea midline Heart:  RRR; did not appreciate any M/R/G Lungs: CTAB, did not appreciate any wheezes, rales, or rhonchi Abdomen: + BS; soft, ND, NT Vascular: 2+ pedal pulses b/l; 2+ radial pulses b/l Extremities: no peripheral edema, no muscle wasting Neuro: in setting of patient's current somnolence and associated inability to follow instructions, unable to perform full neurologic assessment  at this time, including unable to perform full assessment of strength, sensation, or cranial nerve evaluation.   Labs on Admission: I have personally reviewed following labs and imaging studies  CBC: Recent Labs  Lab 10/18/2020 0218  WBC 11.8*  NEUTROABS 7.3  HGB 7.3*  HCT 24.9*  MCV 95.4  PLT 302   Basic Metabolic Panel: Recent Labs  Lab 10/08/2020 0218  NA 144  K 3.5  CL 109  CO2 24  GLUCOSE 192*  BUN 27*  CREATININE 1.48*  CALCIUM 8.4*   GFR: Estimated Creatinine Clearance: 21.8 mL/min (A) (by C-G formula based on SCr  of 1.48 mg/dL (H)). Liver Function Tests: Recent Labs  Lab 10/03/2020 0218  AST 18  ALT 17  ALKPHOS 75  BILITOT 0.7  PROT 5.4*  ALBUMIN 2.3*   No results for input(s): LIPASE, AMYLASE in the last 168 hours. No results for input(s): AMMONIA in the last 168 hours. Coagulation Profile: Recent Labs  Lab 09/24/2020 0218  INR 3.3*   Cardiac Enzymes: No results for input(s): CKTOTAL, CKMB, CKMBINDEX, TROPONINI in the last 168 hours. BNP (last 3 results) No results for input(s): PROBNP in the last 8760 hours. HbA1C: No results for input(s): HGBA1C in the last 72 hours. CBG: No results for input(s): GLUCAP in the last 168 hours. Lipid Profile: No results for input(s): CHOL, HDL, LDLCALC, TRIG, CHOLHDL, LDLDIRECT in the last 72 hours. Thyroid Function Tests: No results for input(s): TSH, T4TOTAL, FREET4, T3FREE, THYROIDAB in the last 72 hours. Anemia Panel: No results for input(s): VITAMINB12, FOLATE, FERRITIN, TIBC, IRON, RETICCTPCT in the last 72 hours. Urine analysis: No results found for: COLORURINE, APPEARANCEUR, LABSPEC, PHURINE, GLUCOSEU, HGBUR, BILIRUBINUR, KETONESUR, PROTEINUR, UROBILINOGEN, NITRITE, LEUKOCYTESUR  Radiological Exams on Admission: CT Head Wo Contrast  Result Date: 09/23/2020 CLINICAL DATA:  Fall on blood thinners EXAM: CT HEAD WITHOUT CONTRAST TECHNIQUE: Contiguous axial images were obtained from the base of the skull through the vertex without intravenous contrast. COMPARISON:  None. FINDINGS: Brain: No evidence of acute territorial infarction, hemorrhage, hydrocephalus,extra-axial collection or mass lesion/mass effect. There is dilatation the ventricles and sulci consistent with age-related atrophy. Low-attenuation changes in the deep white matter consistent with small vessel ischemia. Vascular: No hyperdense vessel or unexpected calcification. Right-sided vertebral artery calcifications are seen. Skull: The skull is intact. No fracture or focal lesion identified.  Sinuses/Orbits: The visualized paranasal sinuses and mastoid air cells are clear. The orbits and globes intact. Other: A 2.3 cm hematoma seen overlying the right frontal skull. Cervical spine: Alignment: Straightening of the normal cervical lordosis is seen. Skull base and vertebrae: Visualized skull base is intact. No atlanto-occipital dissociation. The vertebral body heights are well maintained. No fracture or pathologic osseous lesion seen. There is diffuse osteopenia with heterogeneous appearance to the osseous structures in the mid cervical spine. Soft tissues and spinal canal: The visualized paraspinal soft tissues are unremarkable. No prevertebral soft tissue swelling is seen. The spinal canal is grossly unremarkable, no large epidural collection or significant canal narrowing. Disc levels: Multilevel cervical spine spondylosis seen with disc osteophyte complex and uncovertebral osteophytes most notable from C3 through C6 with severe neural foraminal narrowing and moderate central canal stenosis. Upper chest: Again noted is a ill-defined spiculated ground-glass opacities within the inferior medial left upper lobe air bronchograms. This dates back to prior exam of August 11, 2019. Thoracic inlet is within normal limits. Other: None IMPRESSION: No acute intracranial abnormality. Findings consistent with age related atrophy and chronic small vessel ischemia Soft  tissue hematoma overlying the right frontal skull No acute fracture or malalignment of the spine. Electronically Signed   By: Jonna Clark M.D.   On: 10/13/2020 03:40   CT Cervical Spine Wo Contrast  Result Date: 10/09/2020 CLINICAL DATA:  Fall on blood thinners EXAM: CT HEAD WITHOUT CONTRAST TECHNIQUE: Contiguous axial images were obtained from the base of the skull through the vertex without intravenous contrast. COMPARISON:  None. FINDINGS: Brain: No evidence of acute territorial infarction, hemorrhage, hydrocephalus,extra-axial collection or  mass lesion/mass effect. There is dilatation the ventricles and sulci consistent with age-related atrophy. Low-attenuation changes in the deep white matter consistent with small vessel ischemia. Vascular: No hyperdense vessel or unexpected calcification. Right-sided vertebral artery calcifications are seen. Skull: The skull is intact. No fracture or focal lesion identified. Sinuses/Orbits: The visualized paranasal sinuses and mastoid air cells are clear. The orbits and globes intact. Other: A 2.3 cm hematoma seen overlying the right frontal skull. Cervical spine: Alignment: Straightening of the normal cervical lordosis is seen. Skull base and vertebrae: Visualized skull base is intact. No atlanto-occipital dissociation. The vertebral body heights are well maintained. No fracture or pathologic osseous lesion seen. There is diffuse osteopenia with heterogeneous appearance to the osseous structures in the mid cervical spine. Soft tissues and spinal canal: The visualized paraspinal soft tissues are unremarkable. No prevertebral soft tissue swelling is seen. The spinal canal is grossly unremarkable, no large epidural collection or significant canal narrowing. Disc levels: Multilevel cervical spine spondylosis seen with disc osteophyte complex and uncovertebral osteophytes most notable from C3 through C6 with severe neural foraminal narrowing and moderate central canal stenosis. Upper chest: Again noted is a ill-defined spiculated ground-glass opacities within the inferior medial left upper lobe air bronchograms. This dates back to prior exam of August 11, 2019. Thoracic inlet is within normal limits. Other: None IMPRESSION: No acute intracranial abnormality. Findings consistent with age related atrophy and chronic small vessel ischemia Soft tissue hematoma overlying the right frontal skull No acute fracture or malalignment of the spine. Electronically Signed   By: Jonna Clark M.D.   On: 10/08/2020 03:40   DG Chest  Portable 1 View  Result Date: 10/11/2020 CLINICAL DATA:  Post chest compressions, cardiac arrest EXAM: PORTABLE CHEST 1 VIEW COMPARISON:  Radiograph 07/06/2020, CT 05/19/2020 FINDINGS: Portion of the left costophrenic sulcus is collimated from view. No visible pneumothorax or effusion. Some streaky and hazy opacities in the lung bases are likely atelectatic on a background of more chronic interstitial and bronchitic change. No new consolidative opacity is seen. No convincing secondary features of frank pulmonary edema at this time. Cardiomediastinal contours are similar to priors accounting for differences in technique, with a calcified, tortuous aorta. No visible displaced rib fractures or other acute traumatic abnormality of the chest wall. Degenerative changes are present in the imaged spine and shoulders. Telemetry leads overlie the chest. IMPRESSION: 1. Some streaky and hazy opacities in the lung bases are likely atelectatic on a background of more chronic interstitial and bronchitic change. No new consolidative opacity or frank pulmonary edema. 2. No visible displaced rib fractures or other acute traumatic abnormality of the chest wall. 3.  Aortic Atherosclerosis (ICD10-I70.0). Electronically Signed   By: Kreg Shropshire M.D.   On: 10/16/2020 02:31    Assessment/Plan   Denaly Gatling is a 87 y.o. female with medical history significant for paroxysmal atrial fibrillation on chronic anticoagulation, hypertension, dementia, who is admitted to [facility] on 10/01/2020 with hemorrhagic shock in the setting  of acute on chronic anemia after presenting from SNF to [facility] Emergency Department for evaluation of bleeding from right scalp hematoma.    Principal Problem:   Acute on chronic anemia Active Problems:   Hemorrhagic shock (HCC)   Elevated serum creatinine   Scalp hematoma   Atrial fibrillation (HCC)    #) Acute on chronic anemia: Appears that the patient has a history of chronic anemia  associated with baseline hemoglobin of 9-10, with suspected contribution from anemia of chronic disease.  She presents with acute exacerbation relative to this baseline range in the setting of acute blood loss anemia stemming from active bleed from right forehead hematoma, contribution from acute blood loss anemia further substantiated by laboratory findings include hypochromic results as well as an elevated RDW consistent with iron deficiency anemia.  No other source of active bleed is identified at this time.  Presenting right scalp hematoma is complicated by pharmacologic coagulopathy in the setting of chronic anticoagulation as thromboembolic prophylaxis in the setting of paroxysmal atrial fibrillation.  Pharmacy consult this evening led to initiation of IV Kcentra, as prescribed above.  We will repeat INR later this morning, and closely monitor serial H&H values, as further described.   Plan: Repeat H&H has been ordered to be checked following completed transfusion of second unit PRBC, around 6 AM this morning.  Subsequently an H&H check is also been ordered for 10 AM.  Monitor on telemetry.  Monitor continuous pulse oximetry.  We will also repeat INR at 10 AM.  Holding outpatient anticoagulation, as above.  Close monitoring events.  Blood pressure verity vital signs.  SCDs.  Add on the following anemia labs to pretransfusion labs: Total iron, TIBC, ferritin, B12, folic acid, reticulocyte count.  Trauma surgery team consulted we will continue to follow for ongoing assistance with management of right scalp hematoma.      #) Hemorrhagic shock: Presenting systolic blood pressures in the 80s, which appears to be on the basis of acute blood loss anemia superimposed on chronic anemia, as further described below.  This appears to be further substantiated given significant improvement in blood pressure following transfusion of 2 units PRBC, with ensuing systolic blood pressures into the 120s to 130s.  No other  source of acute blood loss, as further described.  Clinically, obstructive sources of shock, including acute pulmonary realism versus cardiac tamponade appear less likely, clinically, particularly given pharmacologic coagulopathy as evidenced by presenting INR of 3.3.  No evidence of underlying infectious process to suggest contribution from distributive shock due to sepsis, including presenting chest x-ray showing no evidence of acute cardiopulmonary process.  Additionally, screening nasopharyngeal COVID-19/influenza PCR performed in the ED this evening were found to be negative.  Additionally no evidence of anaphylactic contribution at this time.  Cardiogenic contribution is in the differential, but less likely relative to the obvious contribution from hemorrhagic shock, as above, particularly given normalization of blood pressure following transfusion of 2 units PBC, as above.  Of note, patient has documented history of essential hypertension, although the specific outpatient antihypertensive regimen is currently unclear to me, with result of inpatient pharmacy consultation for assistance with reconciliation of outpatient meds currently pending.  Plan: Work-up and management of acute on chronic anemia, as above, including acute 4-hour H&H's ordered through 10 AM this morning.  Repeat INR, as above.  Monitor on telemetry as well as monitoring of continuous pulse oximetry.  Consider EKG.  Holding outpatient anticoagulant.  Add on iron studies, as further described above for  evaluation of potential benefit of IV transfusion of iron.  SCDs.  Check urinalysis to further evaluate for any underlying source of infection.     #) Elevated creatinine: Presenting serum creatinine noted to be 1.48, although chronicity of this finding is currently unclear to me as there do not appear to be any prior serum creatinine data points available in the EMR for point comparison at this time.  I suspect that this elevation  represents some degree of an acute finding, given no prior documentation of a history of CKD.  In that case, would suspect prerenal contribution in the setting of diminished renal perfusion as result of presenting hemorrhagic shock, as above.  Plan: Check urinalysis with microscopic evaluation.  Check random urine sodium as well as random urine creatinine.  Monitor strict I's and O's and daily weights.       #) Paroxysmal atrial fibrillation: Documented history of such. In the setting of a CHA2DS2-VASc score of 4 in the context of the patient's age, gender, and history of hypertension, there is an indication for the patient to be on chronic anticoagulation for thromboembolic prophylaxis. Consistent with this, the patient is reportedly chronically anticoagulated, although the specific patient anticoagulant is currently unclear to me, as further detailed above.  Presenting INR noted to be 3.3 with ensuing reversal via IV Kcentra per inpatient pharmacy consultation in the context of hemorrhagic shock, as above.     Plan: monitor strict I's & O's and daily weights. Check serum magnesium level.  Holding outpatient anticoagulation, as above.  Repeat INR at 10 AM.  Monitor on telemetry.  We will follow for results of inpatient pharmacy consultation for assistance with reconciliation of outpatient med list.     #) Essential hypertension: Documented history of such, although specific outpatient hypertensive regimen is currently clearly.  Plan: inpatient pharmacy consultation for assistance with reconciliation of outpatient med list, although we will plan to hold antihypertensive medications for now in the setting of presenting hemorrhagic shock.  Was monitoring of his blood pressure via routine vital signs.  Monitor on telemetry.     #) Right scalp hematoma: Presenting to bleed associated with patient's recent hematoma represents source of acute blood loss anemia driving presenting hemorrhagic shock,  has further described above.  Dr. Freida Busman of trauma surgery was formally consulted, and subsequently achieved hemostasis with suturing intervention, and trauma surgery will continue to follow.  We will hold outpatient anticoagulation for now in the setting.  Plan: Trauma surgery formally consulted, and will continue to follow, as above.  Work-up and management of acute blood loss anemia superimposed on chronic anemia, including serial H&H values.  Holding outpatient anticoagulation for now, as above.  Repeat INR at 10 AM.  SCDs.      DVT prophylaxis: SCDs Code Status: DNR/DNI (per documentation provided by SNF staff to EMS, as further described above). Disposition Plan: Per Rounding Team Consults called: Dr. Freida Busman of the trauma surgery service was formally consulted, as further described above. Admission status: Inpatient; PCU.    Of note, this patient was added by me to the following Admit List/Treatment Team:  mcadmits     PLEASE NOTE THAT DRAGON DICTATION SOFTWARE WAS USED IN THE CONSTRUCTION OF THIS NOTE.   Angie Fava DO [facility] Pager [phone] From 7PM- 7AM  27-Oct-2020, 4:45 AM

## 2020-10-10 NOTE — Progress Notes (Signed)
2W09 [facility]) Hospital Liaison Note  Sylvia Davila is our current hospice patient with a CTI of Alzheimer's Dementia with Behavioral disturbance. Patient is being treated for acute anemia, right frontal skull hematoma with wound suture. Patient is from [facility]. This is a related admission per [facility] MD.   1/17, patient had a ground-level mechanical fall and was brought to ED. She was found to have superficial bleeding from a hematoma on the right side of the forehead.  It was sutured and patient was discharged back to SNF from the ED.  Later in the night, patient was found to have bleeding from ruptured hematoma and hence EMS was called. In route to ED, patient had a cardiac arrest.  ROSC obtained after 2 minutes of CPR.  Spoke to patients daughter Sylvia Davila who is aware of all events occurring above as well as current patient status. She states that patient does become more delusional with change of atmosphere. Per daughter possible discharge back to [facility] once stable tomorrow 1/19. MD also updated daughter and discussed possibly taking patient off Xarelto due to frequent falls and bleeding.   VS: 119/68, 72HR, 16RR, 100 RA? Not documented I&O:? Labs:  Glucose: 192 (H) BUN: 27 (H) Creatinine: 1.48 (H) Calcium: 8.4 (L) Albumin: 2.3 (L) Total Protein: 5.4 (L) GFR, Estimated: 33 (L) WBC: 11.8 (H) RBC: 2.61 (L) Hemoglobin: 7.3 (L) now 10.1 HCT: 24.9 (L) MCHC: 29.3 (L) RDW: 17.6 (H) Monocyte #: 1.1 (H) Abs Immature Granulocytes: 0.25 (H) Heparin Unfractionated: >2.20 (H) Prothrombin Time: 32.8 (H) INR: 3.3 (H) APTT: 40 (H)  Diagnostics: IMPRESSION: No acute intracranial abnormality.  Findings consistent with age related atrophy and chronic small vessel ischemia  Soft tissue hematoma overlying the right frontal skull  No acute fracture or malalignment of the spine.   Electronically Signed   By: Jonna Clark M.D.   On: 10/05/2020 03:40   IV/PRN  meds: atenolol (TENORMIN) tablet 25 mg Dose: 25 mg Freq: Daily Route: PO  atorvastatin (LIPITOR) tablet 20 mg Dose: 20 mg Freq: Daily Route: PO  folic acid (FOLVITE) tablet 1 mg Dose: 1 mg Freq: Daily Route: PO  LORazepam (ATIVAN) tablet 0.5 mg Dose: 0.5 mg Freq: 3 times daily Route: PO  pantoprazole (PROTONIX) EC tablet 20 mg Dose: 20 mg Freq: Daily Route: PO  sertraline (ZOLOFT) tablet 75 mg Dose: 75 mg Freq: Daily Route: PO  Problem list: Ruptured hematoma on right forehead On long-term anticoagulation -Reportedly had repeated falls since October while on long-term anticoagulation. -Presented this admission for bleeding from ruptured hematoma on the right forehead. -Sutured by trauma surgery.  Hemostasis achieved. -Xarelto on hold.  Kcentra given for an elevated INR.  Acute on chronic anemia -Baseline hemoglobin 9-10. -Acute drop in hemoglobin secondary to scalp hematoma bleeding -Hemoglobin low at 7.3 this morning.  -1 unit PRBC transfused. -Xarelto on hold.  Kcentra given. Recent Labs (within last 365 days)      Recent Labs    09/23/2020 0218 10/08/2020 0858  HGB 7.3* 10.1*  MCV 95.4  --   RETICCTPCT  --  2.1     Essential hypertension. -Patient was hypotensive on arrival.  Blood pressure improved with IV fluid.  Currently blood pressure in normal range.   -Home meds include atenolol 25 mg daily, amlodipine 5 mg daily, Lasix 40 mg daily, -Resume atenolol today.  Keep amlodipine and Lasix on hold.   Paroxysmal A. Fib -Resume atenolol.  I will keep Xarelto on hold.  Probably never resume  it.  Need to be discussed with family.  Hx of stroke Hx of DVT/PE  Elevated creatinine -Creatinine elevated to 1.48.  Unclear baseline.  I think patient's chart is being merged to her old chart after which previous lab results may be accessible. Recent Labs (within last 365 days)     Recent Labs    11/07/2020 0218  BUN 27*  CREATININE 1.48*      Dementia -Supportive care. -Resume Ativan 0.5 mg 3 times daily, sertraline 75 mg daily, tramadol 50 mg every 6 as needed. -Resume all.  Discharge Planning:D/C to [facility] once medically stable. Family Contact:Spoke with daughter Sylvia Davila and updated. IDG: updated WGN:[id number]. DNR. D/C to [facility] once medically stable.  Should patient need discharge transfer by ambulance. Please use [facility] as they have contract this service for our active hospice patients.  Thank you, Yolande Jolly, BSN, [facility] [phone]

## 2020-10-10 NOTE — Consult Note (Addendum)
[name] 08/31/1927  [id number].    Chief Complaint/Reason for Consult: trauma/scalp hematoma  HPI:  Ms. Eppes is a 87 yo female with a history of dementia, a-fib, PE, HTN, and RA who presented from a SNF with bleeding from a scalp hematoma after a recent fall. She is on Xarelto at home, unclear exactly when her last dose was. She went into cardiac arrest and a level 1 trauma alert was called. On my arrival ROSC had been obtained, however she remained hypotensive 70s/50s. There was a significant amount of blood on the stretcher and on dressings over her scalp wound, and on exam active arterial bleeding was present. Bleeding was controlled with sutures and direct pressure and hypotension significantly improved with transfusion of 1unit PRBCs. Patient is not able to provide a history but she was responsive to painful stimuli with very purposeful movements.   On further review of chart and history, the patient's fall was actually about a month ago. She presented to the [facility] ED 2 nights ago with bleeding from the scalp wound, at which time sutures were placed and the bleeding resolved. She was discharged to SNF. Per EMS report, the scalp hematoma "ruptured" again tonight and the patient was brought to the [facility] ED. She has had multiple ED visits for falls over the last few months and was admitted for syncope in August 2021.  Patient has DNR/DNI orders on her chart from SNF.  ROS: Review of Systems  Unable to perform ROS: Dementia   Unable to obtain surgical or family history due to patient's mental status and no family present at bedside.  Past Medical History:  Diagnosis Date   Atrial fibrillation (HCC)    Barrett's esophagus    Dementia (HCC)    Hyperlipidemia    Hypertension    Pulmonary embolism (HCC)    Rheumatoid arthritis (HCC)     Social History:  reports that she has never smoked. She does not have any smokeless tobacco history on file. No history on file  for alcohol use and drug use.  Allergies: Not on File  (Not in a hospital admission)    Physical Exam: Blood pressure (!) 128/50, pulse 73, resp. rate 19, weight 64.4 kg, SpO2 96 %. General: resting comfortably, disoriented Neurological: disoriented, purposeful movements, pupils equal and reactive bilaterally. GCS 11 (eyes 2, verbal 4, motor 5) HEENT: 3cm hematoma on left forehead with sutures in place, active arterial bleeding noted. Dried blood on right scalp but no other lacerations identified. Right periorbital ecchymosis. No scleral icterus CV: regular rate and rhythm, palpable peripheral pulses, extremities warm and well-perfused Respiratory: normal work of breathing, symmetric chest wall expansion, no chest wall deformities or ecchymoses Abdomen: soft, nondistended, nontender to deep palpation. No masses or organomegaly. No abdominal wall ecchymoses. Extremities: warm and well-perfused, no deformities, moving all extremities spontaneously Skin: warm and dry, no jaundice, no rashes or lesions  Results for orders placed or performed during the hospital encounter of 10/12/2020 (from the past 48 hour(s))  CBC with Differential     Status: Abnormal   Collection Time: 10/12/2020  2:18 AM  Result Value Ref Range   WBC 11.8 (H) 4.0 - 10.5 K/uL   RBC 2.61 (L) 3.87 - 5.11 MIL/uL   Hemoglobin 7.3 (L) 12.0 - 15.0 g/dL   HCT 78.2 (L) 95.6 - 21.3 %   MCV 95.4 80.0 - 100.0 fL   MCH 28.0 26.0 - 34.0 pg   MCHC 29.3 (L) 30.0 -  36.0 g/dL   RDW 56.3 (H) 14.9 - 70.2 %   Platelets 302 150 - 400 K/uL   nRBC 0.2 0.0 - 0.2 %   Neutrophils Relative % 63 %   Neutro Abs 7.3 1.7 - 7.7 K/uL   Lymphocytes Relative 25 %   Lymphs Abs 2.9 0.7 - 4.0 K/uL   Monocytes Relative 9 %   Monocytes Absolute 1.1 (H) 0.1 - 1.0 K/uL   Eosinophils Relative 1 %   Eosinophils Absolute 0.1 0.0 - 0.5 K/uL   Basophils Relative 0 %   Basophils Absolute 0.0 0.0 - 0.1 K/uL   Immature Granulocytes 2 %   Abs Immature  Granulocytes 0.25 (H) 0.00 - 0.07 K/uL    Comment: Performed at [facility], [address], [place], [place] [address]  Type and screen [facility]     Status: None (Preliminary result)   Collection Time: Oct 26, 2020  2:18 AM  Result Value Ref Range   ABO/RH(D) A POS    Antibody Screen PENDING    Sample Expiration      10/13/2020,2359 Performed at [facility], [address] [place], [place] [address]    Unit Number [id number]    Blood Component Type RBC LR PHER1    Unit division 00    Status of Unit ISSUED    Transfusion Status OK TO TRANSFUSE    Crossmatch Result COMPATIBLE    Unit tag comment EMERGENCY RELEASE    Unit Number [id number]    Blood Component Type RED CELLS,LR    Unit division 00    Status of Unit ISSUED    Transfusion Status OK TO TRANSFUSE    Crossmatch Result COMPATIBLE    Unit tag comment EMERGENCY RELEASE    DG Chest Portable 1 View  Result Date: 10/26/2020 CLINICAL DATA:  Post chest compressions, cardiac arrest EXAM: PORTABLE CHEST 1 VIEW COMPARISON:  Radiograph 07/06/2020, CT 05/19/2020 FINDINGS: Portion of the left costophrenic sulcus is collimated from view. No visible pneumothorax or effusion. Some streaky and hazy opacities in the lung bases are likely atelectatic on a background of more chronic interstitial and bronchitic change. No new consolidative opacity is seen. No convincing secondary features of frank pulmonary edema at this time. Cardiomediastinal contours are similar to priors accounting for differences in technique, with a calcified, tortuous aorta. No visible displaced rib fractures or other acute traumatic abnormality of the chest wall. Degenerative changes are present in the imaged spine and shoulders. Telemetry leads overlie the chest. IMPRESSION: 1. Some streaky and hazy opacities in the lung bases are likely atelectatic on a background of more chronic interstitial and bronchitic change. No new consolidative  opacity or frank pulmonary edema. 2. No visible displaced rib fractures or other acute traumatic abnormality of the chest wall. 3.  Aortic Atherosclerosis (ICD10-I70.0). Electronically Signed   By: Kreg Shropshire M.D.   On: 2020-10-26 02:31       Assessment/Plan 87 yo female on anticoagulation presenting 1 month after a fall with recurrent bleeding from a scalp hematoma. Brief arrest may have been secondary to acute blood loss, as there was significant bleeding from the scalp wound. Bleeding has now recurred twice in 2 nights several weeks out from the fall. Instructions were given to hold Xarelto when she was discharged from [facility] 2 nights ago. Per EMS report the patient did not have any falls tonight, and she has no other signs of trauma.  I removed the previously placed sutures and  explored the wound at bedside. This was clearly not a new hematoma, however arterial bleeding was presented from the inferior aspect of the wound. This was controlled with two figure-of-eight nylon sutures and a pressure dressings. BP improved with blood transfusion. As there has not been recent trauma and hemodynamics improved with blood transfusion, the trauma activation was downgraded. INR resulted at 3.3, plan to give [facility]. Recommend medical admission for further workup of falls and anticoagulation management. Trauma will follow along.   Sophronia Simas, MD [facility] General, Hepatobiliary and Pancreatic Surgery 2020/10/27 2:54 AM

## 2020-10-10 NOTE — Consult Note (Signed)
WOC Nurse Consult Note: Reason for Consult: Right anterior LE full thickness wound Wound type:traquma from fall Pressure Injury POA:N/A Measurement:7cm x 5.5cm x 0.2cm Wound GJF:TNBZX red, moist Drainage (amount, consistency, odor) serous to serosanguinous in a small amount  Periwound: with evidence of previous wounding, ecchymosis Dressing procedure/placement/frequency: I have provided Nursing with topical care guidance using an antimicrobial nonadherent (xeroform) with daily changes. Heels are to be floated, and a sacral foam used for pressure injury prophylaxis. A pressure redistribution chair cushion is provided for her use when OOB in chair at facility.  WOC nursing team will not follow, but will remain available to this patient, the nursing and medical teams.  Please re-consult if needed. Thanks, Ladona Mow, MSN, RN, GNP, [facility]  Pager# [phone]

## 2020-10-10 NOTE — TOC CAGE-AID Note (Signed)
Transition of Care [facility]) - CAGE-AID Screening   Patient Details  Name: Sylvia Davila MRN: [id number] Date of Birth: 15-May-1927  Clinical Narrative: Screening not completed as patient is unable to participate due to altered mental status.   CAGE-AID Screening: Substance Abuse Screening unable to be completed due to: : Patient unable to participate

## 2020-10-10 NOTE — ED Notes (Signed)
Downgrade trauma to non trauma due to patient falling one month ago, not today or yesterday.  Hematoma has been re bleeding per staff at [facility].

## 2020-10-10 NOTE — ED Provider Notes (Signed)
[facility]EM[facility]GENC[facility]PT [facility] Emergency Department Provider Note MRN:  [id number]  Arrival date & time: 10-13-2020     Chief Complaint   Bleeding History of Present Illness   Sylvia Davila is a 87 y.o. year-old female with a history of anticoagulation presenting to the ED with chief complaint of bleeding.  Patient arriving as a level 1 trauma, postarrest, found to be bleeding from her forehead wound at care facility.  Lost a large amount of blood, brief CPR until it was established that patient is DNR/DNI.    Review of Systems  A complete 10 system review of systems was obtained and all systems are negative except as noted in the HPI and PMH.   Patient's Health History    Past Medical History:  Diagnosis Date  . Atrial fibrillation (HCC)   . Barrett's esophagus   . Dementia (HCC)   . Hyperlipidemia   . Hypertension   . Pulmonary embolism (HCC)   . Rheumatoid arthritis (HCC)       No family history on file.  Social History   Socioeconomic History  . Marital status: Widowed    Spouse name: Not on file  . Number of children: Not on file  . Years of education: Not on file  . Highest education level: Not on file  Occupational History  . Not on file  Tobacco Use  . Smoking status: Never Smoker  . Smokeless tobacco: Not on file  Substance and Sexual Activity  . Alcohol use: Not on file  . Drug use: Not on file  . Sexual activity: Not on file  Other Topics Concern  . Not on file  Social History Narrative  . Not on file   Social Determinants of Health   Financial Resource Strain: Not on file  Food Insecurity: Not on file  Transportation Needs: Not on file  Physical Activity: Not on file  Stress: Not on file  Social Connections: Not on file  Intimate Partner Violence: Not on file     Physical Exam   Vitals:   2020-10-13 0230 2020/10/13 0320  BP: (!) 128/50 (!) 99/50  Pulse: 73 73  Resp: 19 (!) 25  Temp:    SpO2: 96% 100%    CONSTITUTIONAL:  Chronically ill-appearing, NAD NEURO: Somnolent, difficult to arouse, occasionally yells random words EYES:  eyes equal and reactive ENT/NECK:  no LAD, no JVD CARDIO: Regular rate, well-perfused, normal S1 and S2 PULM:  CTAB no wheezing or rhonchi GI/GU:  normal bowel sounds, non-distended, non-tender MSK/SPINE:  No gross deformities, no edema SKIN: Pulsatile bleeding right forehead hematoma PSYCH:  Appropriate speech and behavior  *Additional and/or pertinent findings included in MDM below  Diagnostic and Interventional Summary    EKG Interpretation  Date/Time:    Ventricular Rate:    PR Interval:    QRS Duration:   QT Interval:    QTC Calculation:   R Axis:     Text Interpretation:        Labs Reviewed  CBC WITH DIFFERENTIAL/PLATELET - Abnormal; Notable for the following components:      Result Value   WBC 11.8 (*)    RBC 2.61 (*)    Hemoglobin 7.3 (*)    HCT 24.9 (*)    MCHC 29.3 (*)    RDW 17.6 (*)    Monocytes Absolute 1.1 (*)    Abs Immature Granulocytes 0.25 (*)    All other components within normal limits  COMPREHENSIVE METABOLIC PANEL - Abnormal; Notable for the  following components:   Glucose, Bld 192 (*)    BUN 27 (*)    Creatinine, Ser 1.48 (*)    Calcium 8.4 (*)    Total Protein 5.4 (*)    Albumin 2.3 (*)    GFR, Estimated 33 (*)    All other components within normal limits  PROTIME-INR - Abnormal; Notable for the following components:   Prothrombin Time 32.8 (*)    INR 3.3 (*)    All other components within normal limits  APTT - Abnormal; Notable for the following components:   aPTT 40 (*)    All other components within normal limits  HEPARIN LEVEL (UNFRACTIONATED) - Abnormal; Notable for the following components:   Heparin Unfractionated >2.20 (*)    All other components within normal limits  RESP PANEL BY RT-PCR (FLU A&B, COVID) ARPGX2  TYPE AND SCREEN  ABO/RH    DG Chest Portable 1 View  Final Result    CT Head Wo Contrast    (Results  Pending)  CT Cervical Spine Wo Contrast    (Results Pending)    Medications  prothrombin complex conc human (KCENTRA) IVPB 3,220 Units (has no administration in time range)  sodium chloride 0.9 % bolus 1,000 mL (1,000 mLs Intravenous New Bag/Given 10/02/2020 0241)     Procedures  /  Critical Care .Critical Care Performed by: Sabas Sous, MD Authorized by: Sabas Sous, MD   Critical care provider statement:    Critical care time (minutes):  35   Critical care was necessary to treat or prevent imminent or life-threatening deterioration of the following conditions:  Shock   Critical care was time spent personally by me on the following activities:  Discussions with consultants, evaluation of patient's response to treatment, examination of patient, ordering and performing treatments and interventions, ordering and review of laboratory studies, ordering and review of radiographic studies, pulse oximetry, re-evaluation of patient's condition, obtaining history from patient or surrogate and review of old charts    ED Course and Medical Decision Making  I have reviewed the triage vital signs, the nursing notes, and pertinent available records from the EMR.  Listed above are laboratory and imaging tests that I personally ordered, reviewed, and interpreted and then considered in my medical decision making (see below for details).  Patient sustained head trauma several weeks ago, was recently here in the emergency department few days ago for bleeding of the scalp hematoma.  Patient is anticoagulated.  Some sutures were placed and she was discharged.  She is now bleeding profusely, was pulseless with EMS, she is profoundly hypotensive here in the emergency department with continued pulsatile bleeding.  She was level 1 trauma alerted, we are thankful for the efforts of Dr. Freida Busman of the trauma surgery service, see her separate note for procedural details.  Sutures were placed in the wound by Dr.  Freida Busman to achieve better hemostasis.  There is still some mild oozing.  Pharmacy consulted for reversal of anticoagulation.  Patient given liter of fluid, 2 units emergent release blood.  Her pressures are improving.  She is DNR/DNI, she is protecting her airway.  Will admit to medicine stepdown unit given that there was no trauma today.  The trauma service will follow in consultation and manage the wound.       Elmer Sow. Pilar Plate, MD [facility] Emergency Medicine [facility] [email or web address]  Final Clinical Impressions(s) / ED Diagnoses     ICD-10-CM   1. Acute blood loss anemia  D62   2. Hemorrhagic shock (HCC)  R57.8   3. Bleeding from wound  [email or web address]     ED Discharge Orders    None       Discharge Instructions Discussed with and Provided to Patient:   Discharge Instructions   None       Sabas Sous, MD 10/09/2020 [id number]

## 2020-10-10 NOTE — Progress Notes (Signed)
Re-evaluated patient's forehead laceration this afternoon. No further bleeding noted. Hematoma present. Clean dry dressing applied. Will order bacitracin to be applied daily or BID to laceration. Would recommend discontinuing xarelto in 87 year old patient with dementia and frequent falls over the last month - defer final decision on this to primary care provider. Apply ice prn to hematoma. Recommend washing patient's hair to try to get dried blood out of hair. No other recommendations from a trauma standpoint at this time. We will sign off but are available as needed. Please call with questions or concerns.  Juliet Rude, [facility] 09/30/2020, 3:25 PM Please see Amion for pager number during day hours 7:00am-4:30pm

## 2020-10-11 LAB — BASIC METABOLIC PANEL
Anion gap: 8 (ref 5–15)
BUN: 16 mg/dL (ref 8–23)
CO2: 23 mmol/L (ref 22–32)
Calcium: 8.3 mg/dL — ABNORMAL LOW (ref 8.9–10.3)
Chloride: 113 mmol/L — ABNORMAL HIGH (ref 98–111)
Creatinine, Ser: 0.84 mg/dL (ref 0.44–1.00)
GFR, Estimated: >60 mL/min (ref >60)
Glucose, Bld: 99 mg/dL (ref 70–99)
Potassium: 3.6 mmol/L (ref 3.5–5.1)
Sodium: 144 mmol/L (ref 135–145)

## 2020-10-11 LAB — CBC WITH DIFFERENTIAL/PLATELET
Abs Immature Granulocytes: 0.17 10*3/uL — ABNORMAL HIGH (ref 0.00–0.07)
Basophils Absolute: 0.1 10*3/uL (ref 0.0–0.1)
Basophils Relative: 1 %
Eosinophils Absolute: 0.2 10*3/uL (ref 0.0–0.5)
Eosinophils Relative: 2 %
HCT: 28.1 % — ABNORMAL LOW (ref 36.0–46.0)
Hemoglobin: 8.8 g/dL — ABNORMAL LOW (ref 12.0–15.0)
Immature Granulocytes: 2 %
Lymphocytes Relative: 18 %
Lymphs Abs: 1.6 10*3/uL (ref 0.7–4.0)
MCH: 28.9 pg (ref 26.0–34.0)
MCHC: 31.3 g/dL (ref 30.0–36.0)
MCV: 92.1 fL (ref 80.0–100.0)
Monocytes Absolute: 0.8 10*3/uL (ref 0.1–1.0)
Monocytes Relative: 9 %
Neutro Abs: 6 10*3/uL (ref 1.7–7.7)
Neutrophils Relative %: 68 %
Platelets: 220 10*3/uL (ref 150–400)
RBC: 3.05 MIL/uL — ABNORMAL LOW (ref 3.87–5.11)
RDW: 17.2 % — ABNORMAL HIGH (ref 11.5–15.5)
WBC: 8.7 10*3/uL (ref 4.0–10.5)
nRBC: 0.3 % — ABNORMAL HIGH (ref 0.0–0.2)

## 2020-10-11 NOTE — Progress Notes (Addendum)
PROGRESS NOTE  [place] UMP:[id number] DOB: Jan 22, 1927 DOA: 10-23-2020 PCP: Patient, No Pcp Per  HPI/Recap of past 24 hours:  is a 87 y.o. female with PMH significant for paroxysmal atrial fibrillation on long-term Xarelto, hx of stroke, frequent falls, hypertension, dementia who lives at [facility]. 1/17, patient had a ground-level mechanical fall and was brought to ED.  She was found to have superficial bleeding from a hematoma on the right side of the forehead.  It was sutured and patient was discharged back to SNF from the ED.  Later in the night, patient was found to have bleeding from ruptured hematoma and hence EMS was called. In route to ED, patient had a cardiac arrest.  ROSC obtained after 2 minutes of CPR.  On arrival to the ED, patient was awake but had a low blood pressure of 82/40 with heart rate in 100s. Labs showed a hemoglobin of 7.3, creatinine elevated 1.48. INR 3.3 on Xarelto. Chest x-ray showed bibasilar opacities consistent with atelectasis. CTA did not show any acute intracranial process but showed soft tissue edema in the right frontal skull. In the ED, patient was also seen by trauma surgery Dr. Freida Busman.  Previous suture removed and wound was sutured again. Admitted to hospitalist service. Trauma surgery to follow as consult.  Discussed with patient's daughter Ms. [name] noticed this afternoon. Agreeing to the daughter, patient had a stroke.  Ago after which she was on Eliquis.  It was later stopped because of GI bleeding. But she ended up having PE in [facility] 2020. Since that hospital stay, she was started on [facility] and sent to SNF.  Over the months, she declined physically and mentally. Since April 2021, she is in a memory care unit. For a month now, she has been under hospice care under [facility].   10/11/20: Seen and examined at bedside.  Reports epigastric pain and possibly sternal pain from chest compressions.  Tramadol as needed  given.    Assessment/Plan: Principal Problem:   Acute on chronic anemia Active Problems:   Hemorrhagic shock (HCC)   Elevated serum creatinine   Scalp hematoma   Atrial fibrillation (HCC)  Ruptured hematoma on right forehead While on long-term anticoagulation -Reportedly had repeated falls since October while on long-term anticoagulation. -Presented this admission for bleeding from ruptured hematoma on the right forehead. -Sutured by trauma surgery.  Hemostasis achieved. -Xarelto on hold.  Kcentra given for an elevated INR.  Postcardiac arrest in route Reports possible sternal pain Pain control in place Closely monitor on telemetry Obtain twelve-lead EKG  Acute blood loss on chronic normocytic anemia -Baseline hemoglobin 9-10. -Acute drop in hemoglobin secondary to scalp hematoma bleeding -Hemoglobin low at 7.3 on 10/23/2020.  -1 unit PRBC transfused on October 23, 2020. -Xarelto on hold.  Kcentra given. Hemoglobin 8.8 from 10.1. Continue to monitor H&H Transfuse Hg less than 7  Essential hypertension. -Patient was hypotensive on arrival.  Blood pressure improved with IV fluid.  Currently blood pressure in normal range.   -Home meds include atenolol 25 mg daily, amlodipine 5 mg daily, Lasix 40 mg daily, She is currently on atenolol Continue to hold off home amlodipine and Lasix. Continue to closely monitor vital signs.  Paroxysmal A. Fib Rate controlled with atenolol. Xarelto on hold due to high fall risk and acute blood loss requiring PRBC transfusion. Obtain twelve-lead EKG  Hx of stroke Hx of DVT/PE  Resolved AKI Creatinine back to baseline 0.8 with GFR greater than 60 Presented with creatinine of 1.4  with Continue to avoid nephrotoxins, dehydration and hypotension Monitor urine output  Dementia -Supportive care. Continue home regimen Ativan 0.5 mg 3 times daily, sertraline 75 mg daily, tramadol 50 mg every 6 as needed.  Ambulatory dysfunction post  recurrent falls PT assessed and recommended SNF TOC assisting with SNF placement.   Continue fall precautions   Mobility: PT eval Code Status:  Code Status: DNR  Nutritional status: Body mass index is 25.97 kg/m.    Diet Order                  DIET DYS 3 Room service appropriate? No; Fluid consistency: Thin  Diet effective now                  DVT prophylaxis: SCDs Start: 10-20-2020 0508   Antimicrobials: None Fluid: Not needed Consultants: Trauma surgery Family Communication:  None at bedside.    Status is: Inpatient    Dispo:  Patient From: Other  Planned Disposition: Other  Expected discharge date: 10/13/2020  Medically stable for discharge: No, ongoing management of acute blood loss.         Objective: Vitals:   10/11/20 0604 10/11/20 0756 10/11/20 1134 10/11/20 1535  BP:  (!) 156/56 125/61 (!) 115/55  Pulse:  65    Resp:  13 19 16   Temp:  98 F (36.7 C) 97.6 F (36.4 C) 98.2 F (36.8 C)  TempSrc:  Oral Axillary Axillary  SpO2:  97% 96% 95%  Weight: 69 kg     Height:        Intake/Output Summary (Last 24 hours) at 10/11/2020 1632 Last data filed at 10/11/2020 1400 Gross per 24 hour  Intake 118 ml  Output 550 ml  Net -432 ml   Filed Weights   10/20/20 0220 10/20/2020 0324 10/11/20 0604  Weight: 64.4 kg 64.4 kg 69 kg    Exam:  . General: 87 y.o. year-old female well developed well nourished in no acute distress.  Alert and pleasantly demented.  Right-sided forehead bruising post fall. . Cardiovascular: Regular rate and rhythm with no rubs or gallops.  No thyromegaly or JVD noted.   Marland Kitchen Respiratory: Clear to auscultation with no wheezes or rales. Good inspiratory effort. . Abdomen: Soft nontender nondistended with normal bowel sounds x4 quadrants. . Musculoskeletal: Trace lower extremity edema bilaterally.  Marland Kitchen Psychiatry: Mood is appropriate for condition and setting   Data Reviewed: CBC: Recent Labs  Lab 10/20/2020 0218  2020/10/20 0858 10/11/20 0537  WBC 11.8*  --  8.7  NEUTROABS 7.3  --  6.0  HGB 7.3* 10.1* 8.8*  HCT 24.9* 30.6* 28.1*  MCV 95.4  --  92.1  PLT 302  --  220   Basic Metabolic Panel: Recent Labs  Lab 2020/10/20 0218 10/11/20 0537  NA 144 144  K 3.5 3.6  CL 109 113*  CO2 24 23  GLUCOSE 192* 99  BUN 27* 16  CREATININE 1.48* 0.84  CALCIUM 8.4* 8.3*   GFR: Estimated Creatinine Clearance: 39.7 mL/min (by C-G formula based on SCr of 0.84 mg/dL). Liver Function Tests: Recent Labs  Lab 10/20/2020 0218  AST 18  ALT 17  ALKPHOS 75  BILITOT 0.7  PROT 5.4*  ALBUMIN 2.3*   No results for input(s): LIPASE, AMYLASE in the last 168 hours. No results for input(s): AMMONIA in the last 168 hours. Coagulation Profile: Recent Labs  Lab 2020/10/20 0218  INR 3.3*   Cardiac Enzymes: No results for input(s): CKTOTAL, CKMB, CKMBINDEX, TROPONINI in  the last 168 hours. BNP (last 3 results) No results for input(s): PROBNP in the last 8760 hours. HbA1C: No results for input(s): HGBA1C in the last 72 hours. CBG: No results for input(s): GLUCAP in the last 168 hours. Lipid Profile: No results for input(s): CHOL, HDL, LDLCALC, TRIG, CHOLHDL, LDLDIRECT in the last 72 hours. Thyroid Function Tests: No results for input(s): TSH, T4TOTAL, FREET4, T3FREE, THYROIDAB in the last 72 hours. Anemia Panel: Recent Labs    10/14/2020 0858  RETICCTPCT 2.1   Urine analysis: No results found for: COLORURINE, APPEARANCEUR, LABSPEC, PHURINE, GLUCOSEU, HGBUR, BILIRUBINUR, KETONESUR, PROTEINUR, UROBILINOGEN, NITRITE, LEUKOCYTESUR Sepsis Labs: (procalcitonin:4,lacticidven:4)  ) Recent Results (from the past 240 hour(s))  Resp Panel by RT-PCR (Flu A&B, Covid) Nasopharyngeal Swab     Status: None   Collection Time: 09/25/2020  2:15 AM   Specimen: Nasopharyngeal Swab; Nasopharyngeal(NP) swabs in vial transport medium  Result Value Ref Range Status   SARS Coronavirus 2 by RT PCR NEGATIVE NEGATIVE Final     Comment: (NOTE) SARS-CoV-2 target nucleic acids are NOT DETECTED.  The SARS-CoV-2 RNA is generally detectable in upper respiratory specimens during the acute phase of infection. The lowest concentration of SARS-CoV-2 viral copies this assay can detect is 138 copies/mL. A negative result does not preclude SARS-Cov-2 infection and should not be used as the sole basis for treatment or other patient management decisions. A negative result may occur with  improper specimen collection/handling, submission of specimen other than nasopharyngeal swab, presence of viral mutation(s) within the areas targeted by this assay, and inadequate number of viral copies(<138 copies/mL). A negative result must be combined with clinical observations, patient history, and epidemiological information. The expected result is Negative.  Fact Sheet for Patients:  BloggerCourse.com  Fact Sheet for Healthcare Providers:  SeriousBroker.it  This test is no t yet approved or cleared by the [place] FDA and  has been authorized for detection and/or diagnosis of SARS-CoV-2 by FDA under an Emergency Use Authorization (EUA). This EUA will remain  in effect (meaning this test can be used) for the duration of the COVID-19 declaration under Section 564(b)(1) of the Act, 21 U.S.C.section 360bbb-3(b)(1), unless the authorization is terminated  or revoked sooner.       Influenza A by PCR NEGATIVE NEGATIVE Final   Influenza B by PCR NEGATIVE NEGATIVE Final    Comment: (NOTE) The Xpert Xpress SARS-CoV-2/FLU/RSV plus assay is intended as an aid in the diagnosis of influenza from Nasopharyngeal swab specimens and should not be used as a sole basis for treatment. Nasal washings and aspirates are unacceptable for Xpert Xpress SARS-CoV-2/FLU/RSV testing.  Fact Sheet for Patients: BloggerCourse.com  Fact Sheet for Healthcare  Providers: SeriousBroker.it  This test is not yet approved or cleared by the [place] FDA and has been authorized for detection and/or diagnosis of SARS-CoV-2 by FDA under an Emergency Use Authorization (EUA). This EUA will remain in effect (meaning this test can be used) for the duration of the COVID-19 declaration under Section 564(b)(1) of the Act, 21 U.S.C. section 360bbb-3(b)(1), unless the authorization is terminated or revoked.  Performed at [facility], [address], [place], [place] [address]       Studies: No results found.  Scheduled Meds: . atenolol  25 mg Oral Daily  . atorvastatin  20 mg Oral Daily  . bacitracin   Topical BID  . folic acid  1 mg Oral Daily  . LORazepam  0.5 mg Oral TID  . pantoprazole  20 mg Oral Daily  . polyethylene glycol  17 g Oral Daily  . sertraline  75 mg Oral Daily  . sodium chloride  1 g Oral QHS    Continuous Infusions:   LOS: 1 day     Darlin Drop, MD [facility] Pager [phone]  If 7PM-7AM, please contact night-coverage [email or web address] Password [facility] 10/11/2020, 4:32 PM

## 2020-10-11 NOTE — Evaluation (Signed)
Physical Therapy Evaluation Patient Details Name: Sylvia Davila MRN: [id number] DOB: 1926-12-30 Today's Date: 10/11/2020   History of Present Illness  87 y.o. female with medical history significant for paroxysmal atrial fibrillation on chronic anticoagulation, hypertension, dementia, who is admitted to [facility] on 10/01/2020 with hemorrhagic shock in the setting of acute on chronic anemia after presenting from SNF to [facility] Emergency Department for evaluation of bleeding from right scalp hematoma. Pt experienced fall on on 10/09/2020 with R forehead hematoma. Pt brought back to ED on 1/18 for bleeding from hematoma site, requiring CPR en route to ED.  Clinical Impression  Pt presents to PT with deficits in functional mobility, gait, balance, endurance, strength, power, cognition, and with pain. Pt is limited by pain around ribs, grimacing and guarding during mobility, hesitant to push/pull through UEs to mobilize. Pt requires physical assistance to perform all functional mobility tasks at this time. Due to baseline cognitive deficits and a history of frequent falls PT recommends a return to SNF with 24/7 supervision and PT services.    Follow Up Recommendations SNF;Supervision/Assistance - 24 hour    Equipment Recommendations  None recommended by PT    Recommendations for Other Services       Precautions / Restrictions Precautions Precautions: Fall Restrictions Weight Bearing Restrictions: No      Mobility  Bed Mobility Overal bed mobility: Needs Assistance Bed Mobility: Supine to Sit;Sit to Supine     Supine to sit: Mod assist;HOB elevated Sit to supine: Max assist;HOB elevated        Transfers Overall transfer level: Needs assistance Equipment used: Rolling walker (2 wheeled) Transfers: Sit to/from Stand Sit to Stand: Mod assist;From elevated surface         General transfer comment: pt performs 2 sit to stand transfers with modA, pain in rib  area  Ambulation/Gait                Stairs            Wheelchair Mobility    Modified Rankin (Stroke Patients Only)       Balance Overall balance assessment: Needs assistance Sitting-balance support: Single extremity supported;Feet supported;No upper extremity supported Sitting balance-Leahy Scale: Fair     Standing balance support: Bilateral upper extremity supported Standing balance-Leahy Scale: Poor Standing balance comment: min-modA with BUE support of RW                             Pertinent Vitals/Pain Pain Assessment: Faces Faces Pain Scale: Hurts even more Pain Location: chest/ribs Pain Descriptors / Indicators: Grimacing;Guarding Pain Intervention(s): Monitored during session    Home Living Family/patient expects to be discharged to:: Skilled nursing facility                 Additional Comments: pt with a history of falls at SNF    Prior Function Level of Independence: Needs assistance   Gait / Transfers Assistance Needed: pt reports she ambulates often however she has a history of dementia and is altered at this time. Pt with history of multiple falls           Hand Dominance        Extremity/Trunk Assessment   Upper Extremity Assessment Upper Extremity Assessment: RUE deficits/detail;LUE deficits/detail RUE Deficits / Details: AROM limited due to pain, generalized weakness LUE Deficits / Details: AROM limited due to pain, generalized weakness    Lower Extremity Assessment Lower Extremity Assessment: Generalized  weakness    Cervical / Trunk Assessment Cervical / Trunk Assessment: Kyphotic  Communication   Communication: Expressive difficulties (some word finding difficulties, may be related to dementia)  Cognition Arousal/Alertness: Awake/alert Behavior During Therapy: WFL for tasks assessed/performed Overall Cognitive Status: History of cognitive impairments - at baseline                                  General Comments: pt is only oriented to person, with increased time to recall her last name. Pt with poor memory and tangential thought. No awareness of deficits      General Comments General comments (skin integrity, edema, etc.): VSS on RA, pt with large hematoma on R forehead, incision is dry, dried blood noted across hairline and on R side of head    Exercises     Assessment/Plan    PT Assessment Patient needs continued PT services  PT Problem List Decreased strength;Decreased activity tolerance;Decreased range of motion;Decreased balance;Decreased mobility;Decreased cognition;Decreased knowledge of use of DME;Decreased safety awareness;Decreased knowledge of precautions;Pain       PT Treatment Interventions Gait training;DME instruction;Functional mobility training;Therapeutic activities;Therapeutic exercise;Balance training;Neuromuscular re-education;Cognitive remediation;Patient/family education;Wheelchair mobility training    PT Goals (Current goals can be found in the Care Plan section)  Acute Rehab PT Goals Patient Stated Goal: to improve mobility and reduce pain PT Goal Formulation: With patient Time For Goal Achievement: 10/25/20 Potential to Achieve Goals: Fair    Frequency Min 2X/week   Barriers to discharge        Co-evaluation               AM-PAC PT "6 Clicks" Mobility  Outcome Measure Help needed turning from your back to your side while in a flat bed without using bedrails?: A Lot Help needed moving from lying on your back to sitting on the side of a flat bed without using bedrails?: A Lot Help needed moving to and from a bed to a chair (including a wheelchair)?: A Lot Help needed standing up from a chair using your arms (e.g., wheelchair or bedside chair)?: A Lot Help needed to walk in hospital room?: A Lot Help needed climbing 3-5 steps with a railing? : Total 6 Click Score: 11    End of Session   Activity Tolerance: Patient  limited by pain Patient left: in bed;with call bell/phone within reach;with bed alarm set Nurse Communication: Mobility status PT Visit Diagnosis: Other abnormalities of gait and mobility (R26.89);Muscle weakness (generalized) (M62.81);Pain Pain - part of body:  (ribs)    Time: 8099-8338 PT Time Calculation (min) (ACUTE ONLY): 25 min   Charges:   PT Evaluation $PT Eval Moderate Complexity: 1 Mod          Arlyss Gandy, PT, DPT Acute Rehabilitation Pager: [phone]   Arlyss Gandy 10/11/2020, 9:22 AM

## 2020-10-11 NOTE — Evaluation (Signed)
Occupational Therapy Evaluation Patient Details Name: Sylvia Davila MRN: [id number] DOB: 1926-12-02 Today's Date: 10/11/2020    History of Present Illness 87 y.o. female presenting from SNF to ED on 1/17 after fall resulting in R scalp hematoma. Patient returned to ED on 1/18 with bleeding from ruptured hematoma. Cardiac arrest en route to ED with ROSC obtained after 2 minutes of CPR. PMHx significant for A-fib, HTN, and dementia.   Clinical Impression   PTA patient was LTC resident at [facility] in the memory care until and was requiring assistance for ADLs at baseline. Per facility report, patient has recently been wheelchair bound with ability to self-propel with use of BLE. Prior to recent decline, patient was ambulating around facility with use of RW. Patient was also able to self-feed finger foods. Evaluation limited 2/2 lethargy and pain with patient unable to specify source of pain. Patient with increased facial grimacing with movement of trunk outside of R fetal position. Patient required Total A for transition to supine from side-lying with use of chuck pad in prep for meal. Total A required to take oral medication from RN with maximal cues for swallowing. Patient would benefit from continued acute OT services to maximize safety and independence with self-care tasks in prep for safe d/c to next level of care with recommendation for SNF rehab.     Follow Up Recommendations  SNF    Equipment Recommendations  Other (comment) (Defer to next level of care.)    Recommendations for Other Services       Precautions / Restrictions Precautions Precautions: Fall Precaution Comments: High fall risk Restrictions Weight Bearing Restrictions: No      Mobility Bed Mobility Overal bed mobility: Needs Assistance Bed Mobility: Rolling Rolling: Total assist         General bed mobility comments: Max A rolling L with use of chuck pad. Patient does not attempt to assist. Resistant to  movement.    Transfers                 General transfer comment: Deferred 2/2 pain.    Balance                                           ADL either performed or assessed with clinical judgement   ADL Overall ADL's : Needs assistance/impaired Eating/Feeding: Total assistance;Bed level Eating/Feeding Details (indicate cue type and reason): Patient does not attempt to assist. Max cues and increased time for swalling. Grooming: Total assistance;Bed level Grooming Details (indicate cue type and reason): Does not attempt to assist despite maximal cueing.                                     Vision Patient Visual Report: Other (comment) (Unable to state.) Vision Assessment?:  (Unable to formally assess 2/2 decreased cognition.)     Perception     Praxis      Pertinent Vitals/Pain Pain Assessment: Faces Faces Pain Scale: Hurts worst Pain Location: chest/ribs Pain Descriptors / Indicators: Grimacing;Guarding;Moaning;Crying Pain Intervention(s): Monitored during session     Hand Dominance Right   Extremity/Trunk Assessment Upper Extremity Assessment Upper Extremity Assessment: Difficult to assess due to impaired cognition   Lower Extremity Assessment Lower Extremity Assessment: Defer to PT evaluation   Cervical / Trunk Assessment Cervical / Trunk Assessment: Kyphotic  Communication Communication Communication: Expressive difficulties (Patient only moaning. Did not attempt to communicate.)   Cognition Arousal/Alertness: Lethargic;Suspect due to medications Behavior During Therapy: Flat affect Overall Cognitive Status: History of cognitive impairments - at baseline                                 General Comments: Patient very lethargic 2/2 scheduled ativan. Unable to actively participate with therapy efforts this date.   General Comments  Patient on R side upon entry. Resistent to movement outside of fetal position.  RN aware.    Exercises     Shoulder Instructions      Home Living Family/patient expects to be discharged to:: Other (Comment) Horticulturist, commercial memory care)                                 Additional Comments: Hx of falls      Prior Functioning/Environment Level of Independence: Needs assistance  Gait / Transfers Assistance Needed: [place] bound for last several weeks. Uses BLE to self-propel. Was ambulating with RW around facility ~1 month ago. ADL's / Homemaking Assistance Needed: Assist at baseline with ADLs. Does best with self-feeding finger foods 2/2 dementia.   Comments: Patient is a poor historian at baseline with Hx of dementia.        OT Problem List: Decreased strength;Decreased activity tolerance;Impaired balance (sitting and/or standing);Decreased coordination;Decreased cognition;Decreased safety awareness;Decreased knowledge of use of DME or AE;Impaired UE functional use;Pain      OT Treatment/Interventions: Self-care/ADL training;Therapeutic exercise;Energy conservation;DME and/or AE instruction;Therapeutic activities;Cognitive remediation/compensation;Patient/family education;Balance training    OT Goals(Current goals can be found in the care plan section) Acute Rehab OT Goals Patient Stated Goal: Unable to state OT Goal Formulation: Patient unable to participate in goal setting Time For Goal Achievement: 10/25/20 Potential to Achieve Goals: Fair ADL Goals Pt Will Perform Eating: with set-up;sitting Pt Will Perform Grooming: sitting;with min assist Pt Will Perform Upper Body Dressing: sitting;with min assist Additional ADL Goal #1: Patient will follow 1-step verbal commands with >80% accuracy in prep for ADL tasks. Additional ADL Goal #2: Patient will demo functional transfers with LRAD and Min A.  OT Frequency: Min 2X/week   Barriers to D/C:            Co-evaluation              AM-PAC OT "6 Clicks" Daily Activity     Outcome Measure Help  from another person eating meals?: Total Help from another person taking care of personal grooming?: Total Help from another person toileting, which includes using toliet, bedpan, or urinal?: Total Help from another person bathing (including washing, rinsing, drying)?: Total Help from another person to put on and taking off regular upper body clothing?: Total Help from another person to put on and taking off regular lower body clothing?: Total 6 Click Score: 6   End of Session Nurse Communication: Mobility status  Activity Tolerance: Patient limited by lethargy;Patient limited by pain Patient left: in bed;with call bell/phone within reach;with nursing/sitter in room  OT Visit Diagnosis: Other abnormalities of gait and mobility (R26.89);Muscle weakness (generalized) (M62.81);History of falling (Z91.81);Pain Pain - Right/Left: Right Pain - part of body:  (head and ribs per facial grimacing with movement. Patient unable to specify.)                Time: 0263-7858 OT Time  Calculation (min): 10 min Charges:  OT General Charges $OT Visit: 1 Visit OT Evaluation $OT Eval Moderate Complexity: 1 Mod  Yolando Gillum H. OTR/L Supplemental OT, Department of rehab services [phone]  Jernie Schutt R H. 10/11/2020, 2:18 PM

## 2020-10-11 NOTE — Progress Notes (Signed)
[facility] 2W09 [facility]) Hospital Liaison Note  Sylvia Davila is our current hospice patient with a terminal diagnosis of Alzheimer's Dementia with behavioral disturbance. Patient lives at [place] in the memory care unit where she suffered a fall a month ago. She has apparently continued to bleed from the hematoma site from a month ago. EMS was activated, possible arrest in the field secondary to anemia, patient is admitted for management of anemia and chronic bleeding from hematoma.  Visited Sylvia Davila at the bedside, OT and bedside RN present attempting to feed her. Large hematoma noted to right frontal portion of head. She does not appear to be at her baseline according to report received from her daughter. She appears to be uncomfortable, staff are attempting to address her needs.   V/S:  98.2, 115/55, HR 52, RR 16, SPO2 95% RA I&O: 0/300 Labs: RBC 3.05, hgb 8.8, HCT 28.1, RDW 17.2 IVs/PRNs: ultram 50 mg PO x 2  Problem List: - ruptured hematoma while on chronic anticoagulation - xarelto stopped, site sutured and it is no longer bleeding - acute blood loss on chronic normocytic anemia - transfused 1 unit PRBCs on 1/18, monitor hgb and HCT and transfuse if < 7, xarelto stopped - Alzheimers Dementia - stable, goal was to d/c back to facility as soon as possible to not disrupt her mentation further, but patient was nauseated and in pain today  GOC: return back to [facility] once ablet D/C planning: return to [place] with hospice support Family: updated [name] by phone IDT: hospice team updated  Once ready for d/c, please use [facility] non-emergency transport as they contract this service for our active patients.  Wallis Bamberg RN, BSN, CCRN [facility] Liaison

## 2020-10-12 LAB — BASIC METABOLIC PANEL
Anion gap: 7 (ref 5–15)
BUN: 16 mg/dL (ref 8–23)
CO2: 26 mmol/L (ref 22–32)
Calcium: 8.6 mg/dL — ABNORMAL LOW (ref 8.9–10.3)
Chloride: 114 mmol/L — ABNORMAL HIGH (ref 98–111)
Creatinine, Ser: 0.99 mg/dL (ref 0.44–1.00)
GFR, Estimated: 54 mL/min — ABNORMAL LOW (ref >60)
Glucose, Bld: 110 mg/dL — ABNORMAL HIGH (ref 70–99)
Potassium: 4 mmol/L (ref 3.5–5.1)
Sodium: 147 mmol/L — ABNORMAL HIGH (ref 135–145)

## 2020-10-12 LAB — CBC WITH DIFFERENTIAL/PLATELET
Abs Immature Granulocytes: 0.14 10*3/uL — ABNORMAL HIGH (ref 0.00–0.07)
Basophils Absolute: 0 10*3/uL (ref 0.0–0.1)
Basophils Relative: 1 %
Eosinophils Absolute: 0.4 10*3/uL (ref 0.0–0.5)
Eosinophils Relative: 5 %
HCT: 27.7 % — ABNORMAL LOW (ref 36.0–46.0)
Hemoglobin: 8.5 g/dL — ABNORMAL LOW (ref 12.0–15.0)
Immature Granulocytes: 2 %
Lymphocytes Relative: 22 %
Lymphs Abs: 1.7 10*3/uL (ref 0.7–4.0)
MCH: 28.9 pg (ref 26.0–34.0)
MCHC: 30.7 g/dL (ref 30.0–36.0)
MCV: 94.2 fL (ref 80.0–100.0)
Monocytes Absolute: 0.7 10*3/uL (ref 0.1–1.0)
Monocytes Relative: 10 %
Neutro Abs: 4.9 10*3/uL (ref 1.7–7.7)
Neutrophils Relative %: 60 %
Platelets: 221 10*3/uL (ref 150–400)
RBC: 2.94 MIL/uL — ABNORMAL LOW (ref 3.87–5.11)
RDW: 17.3 % — ABNORMAL HIGH (ref 11.5–15.5)
WBC: 7.8 10*3/uL (ref 4.0–10.5)
nRBC: 0.5 % — ABNORMAL HIGH (ref 0.0–0.2)

## 2020-10-12 MED ORDER — TRAMADOL HCL 50 MG PO TABS: 50.0000 mg | ORAL_TABLET | Freq: Two times a day (BID) | ORAL | 0 refills | Status: AC | PRN

## 2020-10-12 MED ORDER — ATENOLOL 25 MG PO TABS
12.5000 mg | ORAL_TABLET | Freq: Every day | ORAL | Status: DC
  Administered 2020-10-12 – 2020-10-13 (×2): 12.5 mg via ORAL
  Filled 2020-10-12 (×2): qty 1

## 2020-10-12 MED ORDER — ATENOLOL 25 MG PO TABS: 12.5000 mg | ORAL_TABLET | Freq: Every day | ORAL | 0 refills | Status: AC

## 2020-10-12 MED ORDER — TRAMADOL HCL 50 MG PO TABS
50.0000 mg | ORAL_TABLET | ORAL | Status: DC | PRN
  Administered 2020-10-12 – 2020-10-13 (×2): 50 mg via ORAL
  Filled 2020-10-12 (×2): qty 1

## 2020-10-12 MED ORDER — BACITRACIN ZINC 500 UNIT/GM EX OINT: TOPICAL_OINTMENT | Freq: Two times a day (BID) | CUTANEOUS | 0 refills | Status: AC

## 2020-10-12 MED ORDER — LORAZEPAM 0.5 MG PO TABS: 0.5000 mg | ORAL_TABLET | Freq: Three times a day (TID) | ORAL | 0 refills | Status: AC | PRN

## 2020-10-12 NOTE — TOC Initial Note (Addendum)
Transition of Care [facility]) - Initial/Assessment Note    Patient Details  Name: Sylvia Davila MRN: [id number] Date of Birth: 1927/07/02  [facility]) CM/SW Contact:    Lorri Frederick, LCSW Phone Number: 10/12/2020, 1:25 PM  Clinical Narrative:     CSW completed assessment with daughter, Sylvia Davila, as pt unable to participate.  Pt resident at [facility] in the memory care.  She is vaccinated for covid with the booster shot.  [facility] also involved at the facility.  CSW was informed by Holley Dexter that facility is going to decline to allow pt to return due to needing a higher level of care.  Sylvia Davila reports she has not been called by [facility] about this.  She will call [facility] and call CSW back.  0900:  CSW spoke with Sylvia Davila at [place] about pt return.  Sylvia Davila said she will review pt chart and call back. 1100: phone call from Sylvia Davila, [facility].  They were informed [facility] will not take pt back. 1100, 1240: attempts to contact Sylvia Davila at [place].  1600: CSW spoke with daughter Sylvia Davila.  She has been informed by hospice that pt cannot return to [place].  She has not received a call from [place] either.  Discussed other options: she would like referral to SNF.  CSW explained choice to her and medicare website.  Pt SSN not in epic, received from daughter: [id number].               Expected Discharge Plan: Memory Care Barriers to Discharge: Other (comment) (facility not accepting pt back)   Patient Goals and CMS Choice Patient states their goals for this hospitalization and ongoing recovery are:: comfort CMS Medicare.gov Compare Post Acute Care list provided to::  (already [facility] patient)    Expected Discharge Plan and Services Expected Discharge Plan: Memory Care     Post Acute Care Choice: Resumption of Svcs/PTA Provider Living arrangements for the past 2 months: Assisted Living Facility Expected Discharge Date: 10/12/20                                     Prior Living Arrangements/Services Living arrangements for the past 2 months: Assisted Living Facility Lives with:: Facility Resident          Need for Family Participation in Patient Care: Yes (Comment) Care giver support system in place?: Yes (comment) Current home services: Hospice Criminal Activity/Legal Involvement Pertinent to Current Situation/Hospitalization: No - Comment as needed  Activities of Daily Living      Permission Sought/Granted                  Emotional Assessment Appearance:: Appears stated age Attitude/Demeanor/Rapport: Unable to Assess Affect (typically observed): Unable to Assess Orientation: : Oriented to Self Alcohol / Substance Use: Not Applicable Psych Involvement: No (comment)  Admission diagnosis:  Hemorrhagic shock (HCC) [R57.8] Acute blood loss anemia [D62] Bleeding from wound [T14.8XXA] Acute on chronic anemia [D64.9] Patient Active Problem List   Diagnosis Date Noted  . Acute on chronic anemia 10/08/2020  . Hemorrhagic shock (HCC) 10/07/2020  . Elevated serum creatinine 09/28/2020  . Scalp hematoma 10/09/2020  . Atrial fibrillation (HCC)    PCP:  Patient, No Pcp Per Pharmacy:  No Pharmacies Listed    Social Determinants of Health (SDOH) Interventions    Readmission Risk Interventions No flowsheet data found.

## 2020-10-12 NOTE — Plan of Care (Signed)

## 2020-10-12 NOTE — Progress Notes (Signed)
PROGRESS NOTE  Goree WUJ:[id number] DOB: 09/09/1927 DOA: 2020/11/04 PCP: Patient, No Pcp Per  HPI/Recap of past 24 hours: Sylvia Davila is a 87 y.o. female with PMH significant for paroxysmal atrial fibrillation on Xarelto, hx of stroke, PE in November 2020, frequent falls, hypertension, dementia who lives at [facility]. 10/09/20, patient had a ground-level mechanical fall and was brought to ED.  She was found to have superficial bleeding from a hematoma on the right side of the forehead.  It was sutured and patient was discharged back to SNF from the ED.  Later in the night, patient was found to have bleeding from ruptured hematoma and hence EMS was called. In route to ED, patient had a cardiac arrest.  ROSC obtained after 2 minutes of CPR.  Patient is DNR.  Over the months, she declined physically and mentally. Since April 2021, she is in a memory care unit. For a month now, she has been under hospice care under [place] care.   On arrival to the ED, patient was awake but had a low blood pressure of 82/40 with heart rate in 100s. Labs showed a hemoglobin of 7.3, creatinine elevated 1.48. INR 3.3 on Xarelto. Chest x-ray showed bibasilar opacities consistent with atelectasis. CT head did not show any acute intracranial process but showed soft tissue edema in the right frontal skull. In the ED, patient was also seen by trauma surgery Dr. Freida Busman.  Previous suture removed and wound was sutured again. Admitted to hospitalist service. Trauma surgery to follow as consult.  Hospital course complicated by physical debility and intermittent sternal pain for which pain management is in place.  Patient was seen by PT OT with recommendation for SNF.    Patient had presented to the hospital from memory care unit under hospice care and currently is requiring SNF placement.  She will likely discharge to SNF with hospice care.  TOC has been consulted and is assisting with placement.  [facility] is also following.  10/12/20: Patient was seen and examined at her bedside.  She does not appear to be in acute distress.  She is minimally interactive and does not answer when asked if she is in pain.  The hematoma on the right side of her forehead has ceased to bleed.  Pain management is in place along with a bowel regimen.  Assessment/Plan: Principal Problem:   Acute on chronic anemia Active Problems:   Hemorrhagic shock (HCC)   Elevated serum creatinine   Scalp hematoma   Atrial fibrillation (HCC)  Ruptured hematoma on right forehead While on long-term anticoagulation, Xarelto, now discontinued. -Reportedly had repeated falls since October while on long-term anticoagulation. -Presented this admission for bleeding from ruptured hematoma on the right forehead. -Sutured by trauma surgery.  Hemostasis achieved.  Home Xarelto was placed on hold Received Kcentra for an elevated INR 3.3.  Postcardiac arrest and CPR in route ROSC obtained after 2 minutes of CPR. Pain control in place Continue to closely monitor on telemetry.  Acute blood loss on chronic normocytic anemia -Baseline hemoglobin 9-10. Hemoglobin 8.5 from 8.8. She has received 1 unit PRBC on 2020/11/04 when her hemoglobin dropped to 7.3. Continue to hold her Xarelto Continue to monitor H&H and transfuse for hemoglobin less than 7.0.  Supratherapeutic INR Presented with INR 3.3 on Nov 04, 2020. She has received Kcentra  Essential hypertension. -Patient was hypotensive on arrival, did improve with IV fluid. Currently she is off IV fluid BP stable. She is currently  on atenolol 12.5 mg daily. Her home amlodipine and Lasix on hold. Continue to monitor vital signs.  Paroxysmal A. Fib, converted to sinus bradycardia Rate controlled with atenolol, heart rate in the 50s. Xarelto on hold due to high fall risk and acute blood loss requiring PRBC transfusion.  Sinus bradycardia Twelve-lead EKG has  been obtained Heart rate 59, in sinus bradycardia, nonspecific ST-T changes. Continue to monitor on telemetry  Hx of stroke Hx of DVT/PE Continue Lipitor 20 mg daily  Resolved AKI Creatinine back to baseline 0.8 with GFR greater than 60 Presented with creatinine of 1.4 with GFR 33 Continue to avoid nephrotoxins, dehydration and hypotension Continue to monitor urine output  Dementia/chronic anxiety/chronic depression Continue home regimen Ativan 0.5 mg 3 times daily, sertraline 75 mg daily  Ambulatory dysfunction post recurrent falls PT assessed and recommended SNF TOC assisting with SNF placement.   Continue fall precautions   Mobility: PT eval Code Status:  Code Status: DNR  Nutritional status: Body mass index is 25.97 kg/m.    Diet Order                  DIET DYS 3 Room service appropriate? No; Fluid consistency: Thin  Diet effective now                  DVT prophylaxis: SCDs Start: 10/13/2020 0508   Antimicrobials: None Fluid: Not needed Consultants: Trauma surgery Family Communication:  Updated her daughter via phone on October 12, 2020.    Status is: Inpatient    Dispo:  Patient From: Other  Planned Disposition: Other  Expected discharge date: 10/13/20  Medically stable for discharge: Yes, ongoing management of pain post CPR.         Objective: Vitals:   10/12/20 0302 10/12/20 0526 10/12/20 0823 10/12/20 1144  BP: (!) 121/55  140/60 (!) 127/54  Pulse: (!) 57  (!) 52 (!) 54  Resp: 18  18 18   Temp: 97.6 F (36.4 C)  (!) 97 F (36.1 C) 97.6 F (36.4 C)  TempSrc: Axillary  Axillary Oral  SpO2: 93%  94% 97%  Weight:  68.9 kg    Height:        Intake/Output Summary (Last 24 hours) at 10/12/2020 1452 Last data filed at 10/12/2020 0956 Gross per 24 hour  Intake 240 ml  Output 50 ml  Net 190 ml   Filed Weights   10/03/2020 0324 10/11/20 0604 10/12/20 0526  Weight: 64.4 kg 69 kg 68.9 kg    Exam:   General: 87 y.o.  year-old female well-developed well-nourished in no acute distress.  Alert and minimally interactive.  Right-sided forehead bruising post fall.  Cardiovascular: Bradycardic with no rubs or gallops.    Respiratory: Clear to auscultation no wheezes or rales.  Abdomen: Soft nontender normal bowel sounds present.  Musculoskeletal: Trace lower extremity edema bilaterally.  Psychiatry: Mood is appropriate for condition and setting.  Data Reviewed: CBC: Recent Labs  Lab 10/16/2020 0218 10/16/2020 0858 10/11/20 0537 10/12/20 0705  WBC 11.8*  --  8.7 7.8  NEUTROABS 7.3  --  6.0 4.9  HGB 7.3* 10.1* 8.8* 8.5*  HCT 24.9* 30.6* 28.1* 27.7*  MCV 95.4  --  92.1 94.2  PLT 302  --  220 221   Basic Metabolic Panel: Recent Labs  Lab 10/16/2020 0218 10/11/20 0537 10/12/20 0705  NA 144 144 147*  K 3.5 3.6 4.0  CL 109 113* 114*  CO2 24 23 26   GLUCOSE 192* 99 110*  BUN 27* 16 16  CREATININE 1.48* 0.84 0.99  CALCIUM 8.4* 8.3* 8.6*   GFR: Estimated Creatinine Clearance: 33.7 mL/min (by C-G formula based on SCr of 0.99 mg/dL). Liver Function Tests: Recent Labs  Lab 10/13/2020 0218  AST 18  ALT 17  ALKPHOS 75  BILITOT 0.7  PROT 5.4*  ALBUMIN 2.3*   No results for input(s): LIPASE, AMYLASE in the last 168 hours. No results for input(s): AMMONIA in the last 168 hours. Coagulation Profile: Recent Labs  Lab 09/29/2020 0218  INR 3.3*   Cardiac Enzymes: No results for input(s): CKTOTAL, CKMB, CKMBINDEX, TROPONINI in the last 168 hours. BNP (last 3 results) No results for input(s): PROBNP in the last 8760 hours. HbA1C: No results for input(s): HGBA1C in the last 72 hours. CBG: No results for input(s): GLUCAP in the last 168 hours. Lipid Profile: No results for input(s): CHOL, HDL, LDLCALC, TRIG, CHOLHDL, LDLDIRECT in the last 72 hours. Thyroid Function Tests: No results for input(s): TSH, T4TOTAL, FREET4, T3FREE, THYROIDAB in the last 72 hours. Anemia Panel: Recent Labs     10/23/2020 0858  RETICCTPCT 2.1   Urine analysis: No results found for: COLORURINE, APPEARANCEUR, LABSPEC, PHURINE, GLUCOSEU, HGBUR, BILIRUBINUR, KETONESUR, PROTEINUR, UROBILINOGEN, NITRITE, LEUKOCYTESUR Sepsis Labs: @LABRCNTIP (procalcitonin:4,lacticidven:4)  ) Recent Results (from the past 240 hour(s))  Resp Panel by RT-PCR (Flu A&B, Covid) Nasopharyngeal Swab     Status: None   Collection Time: 09/24/2020  2:15 AM   Specimen: Nasopharyngeal Swab; Nasopharyngeal(NP) swabs in vial transport medium  Result Value Ref Range Status   SARS Coronavirus 2 by RT PCR NEGATIVE NEGATIVE Final    Comment: (NOTE) SARS-CoV-2 target nucleic acids are NOT DETECTED.  The SARS-CoV-2 RNA is generally detectable in upper respiratory specimens during the acute phase of infection. The lowest concentration of SARS-CoV-2 viral copies this assay can detect is 138 copies/mL. A negative result does not preclude SARS-Cov-2 infection and should not be used as the sole basis for treatment or other patient management decisions. A negative result may occur with  improper specimen collection/handling, submission of specimen other than nasopharyngeal swab, presence of viral mutation(s) within the areas targeted by this assay, and inadequate number of viral copies(<138 copies/mL). A negative result must be combined with clinical observations, patient history, and epidemiological information. The expected result is Negative.  Fact Sheet for Patients:  10/12/20  Fact Sheet for Healthcare Providers:  BloggerCourse.com  This test is no t yet approved or cleared by the [employer]ious[employer].[place] FDA and  has been authorized for detection and/or diagnosis of SARS-CoV-2 by FDA under an Emergency Use Authorization (EUA). This EUA will remain  in effect (meaning this test can be used) for the duration of the COVID-19 declaration under Section 564(b)(1) of the Act,  21 U.S.C.section 360bbb-3(b)(1), unless the authorization is terminated  or revoked sooner.       Influenza A by PCR NEGATIVE NEGATIVE Final   Influenza B by PCR NEGATIVE NEGATIVE Final    Comment: (NOTE) The Xpert Xpress SARS-CoV-2/FLU/RSV plus assay is intended as an aid in the diagnosis of influenza from Nasopharyngeal swab specimens and should not be used as a sole basis for treatment. Nasal washings and aspirates are unacceptable for Xpert Xpress SARS-CoV-2/FLU/RSV testing.  Fact Sheet for Patients: [place]  Fact Sheet for Healthcare Providers: BloggerCourse.com  This test is not yet approved or cleared by the SeriousBroker.it FDA and has been authorized for detection and/or diagnosis of SARS-CoV-2 by FDA under an Emergency Use Authorization (EUA). This  EUA will remain in effect (meaning this test can be used) for the duration of the COVID-19 declaration under Section 564(b)(1) of the Act, 21 U.S.C. section 360bbb-3(b)(1), unless the authorization is terminated or revoked.  Performed at [facility], [address], [place], [place] [address]       Studies: No results found.  Scheduled Meds:  atenolol  12.5 mg Oral Daily   atorvastatin  20 mg Oral Daily   bacitracin   Topical BID   folic acid  1 mg Oral Daily   LORazepam  0.5 mg Oral TID   pantoprazole  20 mg Oral Daily   polyethylene glycol  17 g Oral Daily   sertraline  75 mg Oral Daily   sodium chloride  1 g Oral QHS    Continuous Infusions:   LOS: 2 days     Darlin Drop, MD [facility] Pager [phone]  If 7PM-7AM, please contact night-coverage [email or web address] Password [email or web address] 10/12/2020, 2:52 PM

## 2020-10-12 NOTE — Progress Notes (Signed)
[facility] 2W09 [facility]) Hospital Liaison Note   Sylvia Davila is our current hospice patient with a terminal diagnosis of Alzheimer's Dementia with behavioral disturbance. Patient lives at [place] in the memory care unit where she suffered a fall a month ago. She has apparently continued to bleed from the hematoma site from a month ago. EMS was activated, possible arrest in the field secondary to anemia, patient is admitted for management of anemia and chronic bleeding from hematoma.  Per Dr. Kirt Boys, [facility] MD, this is a related and covered admission.   Visited [name] at the bedside. Pt was guarding chest and head, grimacing and calling out.  Report exchanged with bedside LPN who had given tramadol PO 45 minutes prior to visit.  Per Barrie Lyme, [facility] RN and CM for [name] at [facility], [facility] cannot accept Ms Racette back if she is dependent for all ADL's; Michail Sermon made aware.  Spoke with daughter Sylvia Davila by phone who had questions about the series of events leading to this hospitalization; Dr. Margo Aye made aware and shared plan to call Sylvia Davila to review plan of care. Pt is appropriate for GIP level of care due to severe pain, as evidenced by grimacing, guarding, and calling out, which has required reassessment by MD to address inpatient pain management regime. V/S:  97.6, 127/54, HR 54, RR 18, SPO2 97% RA I&O: 118/300 Abnormal Labs: Na 147, Cl 114, Ca 8.6, GFR 54, RBC 2.94, Hgb 8.5, RDW 17.3 IVs/PRNs: ultram 50 mg PO x 4   Problem List: - ruptured hematoma while on chronic anticoagulation  - acute blood loss on chronic normocytic anemia  - Alzheimers Dementia    GOC: DNR D/C planning: Based on message from [place], [facility] RN CM, [facility] not able to accept pt back. SNF recommended by PT/OT.  CSW addressing plan of care with pt's daughter Sylvia Davila.  Family: updated Sylvia Davila by phone IDT: hospice team updated   Once ready for d/c, please use [facility]  non-emergency transport as they contract this service for our active patients.    Thank you for the opportunity to participate in this patient's care.     Gillian Scarce, BSN, RN [facility] Liaison   [phone]

## 2020-10-13 DIAGNOSIS — Z7189 Other specified counseling: Secondary | ICD-10-CM

## 2020-10-13 DIAGNOSIS — D649 Anemia, unspecified: Secondary | ICD-10-CM

## 2020-10-13 DIAGNOSIS — Z515 Encounter for palliative care: Secondary | ICD-10-CM

## 2020-10-13 DIAGNOSIS — Z66 Do not resuscitate: Secondary | ICD-10-CM

## 2020-10-13 LAB — CBC WITH DIFFERENTIAL/PLATELET
Abs Immature Granulocytes: 0.14 10*3/uL — ABNORMAL HIGH (ref 0.00–0.07)
Basophils Absolute: 0 10*3/uL (ref 0.0–0.1)
Basophils Relative: 0 %
Eosinophils Absolute: 0.4 10*3/uL (ref 0.0–0.5)
Eosinophils Relative: 6 %
HCT: 28.8 % — ABNORMAL LOW (ref 36.0–46.0)
Hemoglobin: 8.7 g/dL — ABNORMAL LOW (ref 12.0–15.0)
Immature Granulocytes: 2 %
Lymphocytes Relative: 22 %
Lymphs Abs: 1.5 10*3/uL (ref 0.7–4.0)
MCH: 28.8 pg (ref 26.0–34.0)
MCHC: 30.2 g/dL (ref 30.0–36.0)
MCV: 95.4 fL (ref 80.0–100.0)
Monocytes Absolute: 0.7 10*3/uL (ref 0.1–1.0)
Monocytes Relative: 10 %
Neutro Abs: 4.1 10*3/uL (ref 1.7–7.7)
Neutrophils Relative %: 60 %
Platelets: 212 10*3/uL (ref 150–400)
RBC: 3.02 MIL/uL — ABNORMAL LOW (ref 3.87–5.11)
RDW: 17.2 % — ABNORMAL HIGH (ref 11.5–15.5)
WBC: 6.8 10*3/uL (ref 4.0–10.5)
nRBC: 0.4 % — ABNORMAL HIGH (ref 0.0–0.2)

## 2020-10-13 LAB — BASIC METABOLIC PANEL
Anion gap: 7 (ref 5–15)
BUN: 12 mg/dL (ref 8–23)
CO2: 26 mmol/L (ref 22–32)
Calcium: 8.4 mg/dL — ABNORMAL LOW (ref 8.9–10.3)
Chloride: 112 mmol/L — ABNORMAL HIGH (ref 98–111)
Creatinine, Ser: 0.92 mg/dL (ref 0.44–1.00)
GFR, Estimated: 59 mL/min — ABNORMAL LOW (ref >60)
Glucose, Bld: 99 mg/dL (ref 70–99)
Potassium: 4.1 mmol/L (ref 3.5–5.1)
Sodium: 145 mmol/L (ref 135–145)

## 2020-10-13 MED ORDER — ONDANSETRON HCL 4 MG/2ML IJ SOLN: 4.0000 mg | Freq: Four times a day (QID) | INTRAMUSCULAR | Status: DC | PRN

## 2020-10-13 MED ORDER — MORPHINE BOLUS VIA INFUSION
2.0000 mg | INTRAVENOUS | Status: DC | PRN
  Filled 2020-10-13: qty 2

## 2020-10-13 MED ORDER — LORAZEPAM 2 MG/ML IJ SOLN
0.5000 mg | Freq: Three times a day (TID) | INTRAMUSCULAR | Status: DC
  Administered 2020-10-14 – 2020-10-16 (×9): 0.5 mg via INTRAVENOUS
  Filled 2020-10-13 (×10): qty 1

## 2020-10-13 MED ORDER — MORPHINE SULFATE (PF) 2 MG/ML IV SOLN
2.0000 mg | Freq: Once | INTRAVENOUS | Status: AC
  Administered 2020-10-13: 2 mg via INTRAVENOUS
  Filled 2020-10-13: qty 1

## 2020-10-13 MED ORDER — METHOTREXATE 2.5 MG PO TABS
10.0000 mg | ORAL_TABLET | ORAL | Status: DC
  Administered 2020-10-13: 10 mg via ORAL
  Filled 2020-10-13: qty 4

## 2020-10-13 MED ORDER — LORAZEPAM 2 MG/ML IJ SOLN
0.5000 mg | INTRAMUSCULAR | Status: DC | PRN
  Administered 2020-10-13 (×2): 1 mg via INTRAVENOUS
  Filled 2020-10-13: qty 1

## 2020-10-13 MED ORDER — OXYCODONE HCL 5 MG PO TABS
5.0000 mg | ORAL_TABLET | Freq: Four times a day (QID) | ORAL | Status: DC | PRN
  Administered 2020-10-13 (×2): 5 mg via ORAL
  Filled 2020-10-13 (×2): qty 1

## 2020-10-13 MED ORDER — BIOTENE DRY MOUTH MT LIQD: 15.0000 mL | OROMUCOSAL | Status: DC | PRN

## 2020-10-13 MED ORDER — POLYVINYL ALCOHOL 1.4 % OP SOLN
2.0000 [drp] | OPHTHALMIC | Status: DC | PRN
  Filled 2020-10-13: qty 15

## 2020-10-13 MED ORDER — SENNOSIDES-DOCUSATE SODIUM 8.6-50 MG PO TABS: 2.0000 | ORAL_TABLET | Freq: Every day | ORAL | Status: DC

## 2020-10-13 MED ORDER — MORPHINE 100MG IN NS 100ML (1MG/ML) PREMIX INFUSION
2.0000 mg/h | INTRAVENOUS | Status: DC
  Administered 2020-10-13: 1 mg/h via INTRAVENOUS
  Administered 2020-10-16: 2 mg/h via INTRAVENOUS
  Filled 2020-10-13 (×2): qty 100

## 2020-10-13 MED ORDER — ENSURE ENLIVE PO LIQD: 237.0000 mL | Freq: Two times a day (BID) | ORAL | Status: DC

## 2020-10-13 MED ORDER — GLYCOPYRROLATE 0.2 MG/ML IJ SOLN
0.4000 mg | INTRAMUSCULAR | Status: DC
  Administered 2020-10-13 – 2020-10-16 (×20): 0.4 mg via INTRAVENOUS
  Filled 2020-10-13 (×21): qty 2

## 2020-10-13 NOTE — Consult Note (Signed)
Palliative Medicine Inpatient Consult Note  Reason for consult:  Goals of Care  HPI:  Per intake H&P --> Sylvia Mooreis a 87 y.o.femalewith PMH significant for paroxysmal atrial fibrillation on Xarelto,hx of stroke, PE in November 2020,frequent falls,hypertension, dementiawho lives at [facility].  Palliative care was asked to get involved to address symptoms and goals of care. Sylvia Davila is known to the PMT service as we saw her in August.   Clinical Assessment/Goals of Care:  *Please note that this is a verbal dictation therefore any spelling or grammatical errors are due to the "Dragon Medical One" system interpretation.  I have reviewed medical records including EPIC notes, labs and imaging, received report from bedside RN, assessed the patient who is wincing and appearing exceptionally uncomfortable.    I called Sylvia Davila (daughter) to further discuss diagnosis prognosis, GOC, EOL wishes, disposition and options.   I introduced Palliative Medicine as specialized medical care for people living with serious illness. It focuses on providing relief from the symptoms and stress of a serious illness. The goal is to improve quality of life for both the patient and the family.  Sylvia Davila shares with me that her mother is from [place] originally. She moved to [place] about fifteen years ago. She was married twice, each marriage for about fifteen years. She recently lost her partner. She has four children though one of her daughters has passed away. 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  She volunteered at [employer] for many years. She is a woman of faith. She was raised as a Protestant though since locating here has been practicing within the [employer].  In regards to Sylvia Davila's functional state, up until about  one year ago in November she was fully independent. It was at this time that she suffered  bilateral PE's and required hospitalization. Per her daughter, Sylvia Davila this led to her "downfall." Since then she has been to multiple SNF's and ultimately has ended up at [facility] assisted living facility.Sylvia Davila recounts that Sylvia is only able to "pace the halls" and feed herself. She has been fairly debilitated as of recently requiring more asstiance.  A detailed discussion was had today regarding advanced directives, Sylvia Davila shares that her mother had completed a living will - this was emailed to the PMT and scanned into [employer] under Sylvia Davila's prior MRN #[id number]  Concepts specific to code status, artifical feeding and hydration, continued IV antibiotics and rehospitalization was had. Per Sylvia Davila's living will she would not wish for her to have resuscitation - she is a confirmed DNAR/DNI and would not want heroic interventions to remain living.   We discussed bridging his present health state and how poorly she is overall doing.  We discussed that she is having a decrease in urinary output which often can indicate that we are getting closer to end-of-life.  We further discussed that her p.o. intake has declined.  We reviewed patient's painful symptoms and determine that at this point in time Sylvia has "suffered enough".  We talked about transition to comfort measures in house and what that would entail inclusive of medications to control pain, dyspnea, agitation, nausea, itching, and hiccups.   We discussed stopping all uneccessary measures such as blood draws, needle sticks, and frequent vital signs. Sylvia Davila was very much aligned with these goals  and states that "this is the right decision". Utilized reflective listening throughout our time together given the difficulty of the situation.  Discussed the importance of continued conversation with family and their  medical providers  regarding overall plan of care and treatment options, ensuring decisions are within the context of the patients values and GOCs.  Decision Maker: Sylvia Davila (Daughter) [phone]  SUMMARY OF RECOMMENDATIONS   DNAR/DNI  Comfort focused care - Will initiate morphine gtt please refer to [facility] for additional comfort medications  [facility] updated on plan for transition to full comfort oriented care  Spiritual Support requested  Code Status/Advance Care Planning: DNAR/DNI   Palliative Prophylaxis:   Oral care, pain, agitation  Additional Recommendations (Limitations, Scope, Preferences):  Comfort focused care   Psycho-social/Spiritual:   Desire for further Chaplaincy support: Yes - [facility]  Additional Recommendations: Education on end of life process   Prognosis: Poor - limited to days in the setting of decreasing renal function  Discharge Planning: Discharge will likely be celestial  Vitals:   10/13/20 1500 10/13/20 1537  BP: (!) 180/154 132/73  Pulse: 63 63  Resp: 17 19  Temp: 98.7 F (37.1 C) 98.7 F (37.1 C)  SpO2: 96% 100%    Intake/Output Summary (Last 24 hours) at 10/13/2020 1752 Last data filed at 10/13/2020 1037 Gross per 24 hour  Intake 480 ml  Output 450 ml  Net 30 ml   Last Weight  Most recent update: 10/12/2020  5:27 AM   Weight  68.9 kg (151 lb 14.4 oz)           Gen:  Frail elderly F HEENT: dry mucous membranes - large R scalp hematoma with oozing blood CV: Regular rate and rhythm  PULM: RA, clear to auscultation bilaterally  ABD: soft/nontender  EXT: (+) trace LE edema  Neuro: Somnolent - not responsive   PPS: 10%   This conversation/these recommendations were discussed with patient primary care team, Dr. Margo Aye  Time In:  1715 Time Out: 1825 Total Time: 70 Greater than 50%  of this time was spent counseling and coordinating care related to the above assessment and plan.  Lamarr Lulas [place] Palliative Medicine  Team Team Cell Phone: [phone] Please utilize secure chat with additional questions, if there is no response within 30 minutes please call the above phone number  Palliative Medicine Team providers are available by phone from 7am to 7pm daily and can be reached through the team cell phone.  Should this patient require assistance outside of these hours, please call the patient's attending physician.

## 2020-10-13 NOTE — Progress Notes (Signed)
PT Cancellation Note  Patient Details Name: Sylvia Davila MRN: [id number] DOB: 1927/08/07   Cancelled Treatment:    Reason Eval/Treat Not Completed: Fatigue/lethargy limiting ability to participate;Patient's level of consciousness. Pt unable to respond to questions, verbal or manual cueing to participate in session. Pt crying when attempted to assist with movement.  Ginette Otto, DPT Acute Rehabilitation Services [phone]   Lucretia Field 10/13/2020, 9:57 AM

## 2020-10-13 NOTE — Progress Notes (Addendum)
PROGRESS NOTE  [place] GBT:[id number] DOB: 29-Apr-1927 DOA: 07-Nov-2020 PCP: Patient, No Pcp Per  HPI/Recap of past 24 hours: Sylvia Davila is a 87 y.o. female with PMH significant for paroxysmal atrial fibrillation on Xarelto, hx of stroke, PE in November 2020, frequent falls, hypertension, dementia who lives at [facility] memory care.  On further review of chart and history, the patient's fall was actually about a month ago prior to admission. She presented to the [facility] ED 2 nights ago with bleeding from the scalp wound, at which time sutures were placed and the bleeding resolved. She was discharged to SNF. Per EMS report, the scalp hematoma "ruptured" again the night of her presentation and the patient was brought to the [facility] ED. She has had multiple ED visits for falls over the last few months and was admitted for syncope in August 2021.  Patient has DNR/DNI orders on her chart from SNF.  In route to ED, patient had a cardiac arrest.  ROSC obtained after 2 minutes of CPR.    Over the months, she declined physically and mentally. Since April 2021, she is in a memory care unit. For a month now, she has been under hospice care under [facility].   On arrival to the ED, patient was awake but had a low blood pressure of 82/40 with heart rate in 100s. Labs showed a hemoglobin of 7.3, creatinine elevated 1.48. INR 3.3 on Xarelto. Chest x-ray showed bibasilar opacities consistent with atelectasis. CT head did not show any acute intracranial process but showed soft tissue edema in the right frontal skull. In the ED, patient was seen by trauma surgery Dr. Freida Busman.  Previous suture removed and wound was sutured again. Admitted to hospitalist service.  Hospital course complicated by physical debility and intermittent sternal pain for which pain management is in place.  Patient was seen by PT OT with recommendation for SNF.  TOC has been consulted and is assisting with placement.   [facility] is also following.  Patient has had very poor oral intake.  She has feeding assistance however she has not been wanting to eat.  10/13/20: Seen and examined.  Mainly complains of sternal pain.  Started oxycodone as needed for moderate to severe pain.  Patient has had poor oral intake.  Updated her daughter via phone.  Assessment/Plan: Principal Problem:   Acute on chronic anemia Active Problems:   Hemorrhagic shock (HCC)   Elevated serum creatinine   Scalp hematoma   Atrial fibrillation (HCC)  Ruptured hematoma on right forehead While on long-term anticoagulation, Xarelto, now discontinued. -Reportedly recurrent falls while on anticoagulation. -Presented this admission for bleeding from ruptured hematoma on the right forehead. -Sutured by trauma surgery, Dr. Freida Busman on 2020/11/07.  Hemostasis achieved.  Home Xarelto was placed on hold Received Kcentra for an elevated INR 3.3.  Poor oral intake Patient has had very poor oral intake Feeding assistance in place however has refused to eat her meals Informed her daughter who will come to visit possibly on 10/14/2020. Continue to encourage increase oral protein calorie intake.  Moderate protein calorie malnutrition Albumin 2.3 BMI 27 Moderate muscle mass loss Start feeding supplement Continue to encourage increase oral protein calorie intake as tolerated Continue feeding assistance  Dysphagia Currently on dysphagia 3 diet with thin liquid Continue aspiration precautions  Postcardiac arrest and brief CPR in route ROSC obtained after 2 minutes of CPR. Continue pain control Continue to closely monitor on telemetry.  Acute blood loss on chronic  normocytic anemia, hemoglobin is stable. -Baseline hemoglobin 9-10. Hemoglobin uptrending 8.7 from 8.5  She has received 1 unit PRBC on October 10, 2020 when her hemoglobin dropped to 7.3. Continue to hold her Xarelto No overt bleeding at this  time.  Supratherapeutic INR Presented with INR 3.3 on October 10, 2020. She has received Kcentra  Essential hypertension. -Patient was hypotensive on arrival, did improve with IV fluid. Currently she is off IV fluid Continue atenolol 12.5 mg daily. Her home amlodipine and Lasix on hold, continue to hold. Continue to monitor vital signs.  Paroxysmal A. Fib, rate controlled. Continue atenolol for rate control. Xarelto on hold due to high fall risk and acute blood loss requiring PRBC transfusion.  Resolved sinus bradycardia Atenolol dose was decreased to 12.5 daily  Hx of stroke Hx of DVT/PE Continue Lipitor 20 mg daily  Resolved AKI Creatinine back to baseline 0.9 with GFR 59. Presented with creatinine of 1.4 with GFR 33 Continue to avoid nephrotoxins, dehydration and hypotension Monitor urine output as able.  Chronic anxiety/chronic depression Continue home regimen Ativan 0.5 mg 3 times daily, sertraline 75 mg daily  Advanced Alzheimer's dementia With history of behavioral disturbance Reorient as needed Continue fall, aspiration and delirium precautions.  Ambulatory dysfunction post recurrent falls PT assessed and recommended SNF TOC assisting with SNF placement.   Continue fall precautions  Goals of care Palliative care team has been consulted to assist with establishing goals of care Currently the patient is DNR Poor prognosis in the setting of very poor oral intake, moderate protein calorie malnutrition, advanced Alzheimer's dementia, paroxysmal A. fib, frequent fall, advanced age.   Mobility: PT eval recommending SNF. Code Status:  Code Status: DNR  Nutritional status: Body mass index is 25.97 kg/m.    Diet Order                  DIET DYS 3 Room service appropriate? No; Fluid consistency: Thin  Diet effective now                  DVT prophylaxis: SCDs Start: 10/06/2020 0508   Antimicrobials: None Fluid: Not needed Consultants: Trauma  surgery Family Communication:  Updated her daughter via phone on October 12, 2020 and on 10/13/2020.    Status is: Inpatient    Dispo:  Patient From: Other  Planned Disposition: Skilled Nursing Facility  Expected discharge date: 10/13/20  Medically stable for discharge: Yes, ongoing management of sternal pain post CPR.         Objective: Vitals:   10/13/20 0808 10/13/20 1146 10/13/20 1500 10/13/20 1537  BP: 115/66 125/65 (!) 180/154 132/73  Pulse: 60 61 63 63  Resp: 15 17 17 19   Temp: 97.6 F (36.4 C) 98.3 F (36.8 C) 98.7 F (37.1 C) 98.7 F (37.1 C)  TempSrc: Oral Axillary Axillary Axillary  SpO2: 94% 95% 96% 100%  Weight:      Height:        Intake/Output Summary (Last 24 hours) at 10/13/2020 1544 Last data filed at 10/13/2020 1037 Gross per 24 hour  Intake 480 ml  Output 450 ml  Net 30 ml   Filed Weights   10/23/2020 0324 10/11/20 0604 10/12/20 0526  Weight: 64.4 kg 69 kg 68.9 kg    Exam:  . General: 87 y.o. year-old female well-developed well-nourished in no acute distress.  Alert and interactive.  Right-sided forehead bruising post fall. . Cardiovascular: Regular rate and rhythm no rubs or gallops. 82 Respiratory: Clear to auscultation no  wheezes or rales.  Poor inspiratory effort. . Abdomen: Obese soft nontender bowel sounds present. . Musculoskeletal: Trace lower extremity edema bilaterally. Marland Kitchen Psychiatry: Mood is appropriate for condition and setting.  Data Reviewed: CBC: Recent Labs  Lab 10-31-2020 0218 10/31/2020 0858 10/11/20 0537 10/12/20 0705 10/13/20 0312  WBC 11.8*  --  8.7 7.8 6.8  NEUTROABS 7.3  --  6.0 4.9 4.1  HGB 7.3* 10.1* 8.8* 8.5* 8.7*  HCT 24.9* 30.6* 28.1* 27.7* 28.8*  MCV 95.4  --  92.1 94.2 95.4  PLT 302  --  220 221 212   Basic Metabolic Panel: Recent Labs  Lab 10/31/20 0218 10/11/20 0537 10/12/20 0705 10/13/20 0312  NA 144 144 147* 145  K 3.5 3.6 4.0 4.1  CL 109 113* 114* 112*  CO2 GLUCOSE 192* 99  110* 99  BUN 27* CREATININE 1.48* 0.84 0.99 0.92  CALCIUM 8.4* 8.3* 8.6* 8.4*   GFR: Estimated Creatinine Clearance: 36.2 mL/min (by C-G formula based on SCr of 0.92 mg/dL). Liver Function Tests: Recent Labs  Lab 2020/10/31 0218  AST 18  ALT 17  ALKPHOS 75  BILITOT 0.7  PROT 5.4*  ALBUMIN 2.3*   No results for input(s): LIPASE, AMYLASE in the last 168 hours. No results for input(s): AMMONIA in the last 168 hours. Coagulation Profile: Recent Labs  Lab Oct 31, 2020 0218  INR 3.3*   Cardiac Enzymes: No results for input(s): CKTOTAL, CKMB, CKMBINDEX, TROPONINI in the last 168 hours. BNP (last 3 results) No results for input(s): PROBNP in the last 8760 hours. HbA1C: No results for input(s): HGBA1C in the last 72 hours. CBG: No results for input(s): GLUCAP in the last 168 hours. Lipid Profile: No results for input(s): CHOL, HDL, LDLCALC, TRIG, CHOLHDL, LDLDIRECT in the last 72 hours. Thyroid Function Tests: No results for input(s): TSH, T4TOTAL, FREET4, T3FREE, THYROIDAB in the last 72 hours. Anemia Panel: No results for input(s): VITAMINB12, FOLATE, FERRITIN, TIBC, IRON, RETICCTPCT in the last 72 hours. Urine analysis: No results found for: COLORURINE, APPEARANCEUR, LABSPEC, PHURINE, GLUCOSEU, HGBUR, BILIRUBINUR, KETONESUR, PROTEINUR, UROBILINOGEN, NITRITE, LEUKOCYTESUR Sepsis Labs: (procalcitonin:4,lacticidven:4)  ) Recent Results (from the past 240 hour(s))  Resp Panel by RT-PCR (Flu A&B, Covid) Nasopharyngeal Swab     Status: None   Collection Time: 10-31-2020  2:15 AM   Specimen: Nasopharyngeal Swab; Nasopharyngeal(NP) swabs in vial transport medium  Result Value Ref Range Status   SARS Coronavirus 2 by RT PCR NEGATIVE NEGATIVE Final    Comment: (NOTE) SARS-CoV-2 target nucleic acids are NOT DETECTED.  The SARS-CoV-2 RNA is generally detectable in upper respiratory specimens during the acute phase of infection. The lowest concentration of SARS-CoV-2  viral copies this assay can detect is 138 copies/mL. A negative result does not preclude SARS-Cov-2 infection and should not be used as the sole basis for treatment or other patient management decisions. A negative result may occur with  improper specimen collection/handling, submission of specimen other than nasopharyngeal swab, presence of viral mutation(s) within the areas targeted by this assay, and inadequate number of viral copies(<138 copies/mL). A negative result must be combined with clinical observations, patient history, and epidemiological information. The expected result is Negative.  Fact Sheet for Patients:  BloggerCourse.com  Fact Sheet for Healthcare Providers:  SeriousBroker.it  This test is no t yet approved or cleared by the [place] FDA and  has been authorized for detection and/or diagnosis of SARS-CoV-2 by FDA under an Emergency Use Authorization (EUA).  This EUA will remain  in effect (meaning this test can be used) for the duration of the COVID-19 declaration under Section 564(b)(1) of the Act, 21 U.S.C.section 360bbb-3(b)(1), unless the authorization is terminated  or revoked sooner.       Influenza A by PCR NEGATIVE NEGATIVE Final   Influenza B by PCR NEGATIVE NEGATIVE Final    Comment: (NOTE) The Xpert Xpress SARS-CoV-2/FLU/RSV plus assay is intended as an aid in the diagnosis of influenza from Nasopharyngeal swab specimens and should not be used as a sole basis for treatment. Nasal washings and aspirates are unacceptable for Xpert Xpress SARS-CoV-2/FLU/RSV testing.  Fact Sheet for Patients: BloggerCourse.com  Fact Sheet for Healthcare Providers: SeriousBroker.it  This test is not yet approved or cleared by the [place] FDA and has been authorized for detection and/or diagnosis of SARS-CoV-2 by FDA under an Emergency Use Authorization  (EUA). This EUA will remain in effect (meaning this test can be used) for the duration of the COVID-19 declaration under Section 564(b)(1) of the Act, 21 U.S.C. section 360bbb-3(b)(1), unless the authorization is terminated or revoked.  Performed at [facility], [address], [place], [place] [address]       Studies: No results found.  Scheduled Meds: . atenolol  12.5 mg Oral Daily  . atorvastatin  20 mg Oral Daily  . bacitracin   Topical BID  . folic acid  1 mg Oral Daily  . LORazepam  0.5 mg Oral TID  . methotrexate  10 mg Oral Q Fri  . pantoprazole  20 mg Oral Daily  . polyethylene glycol  17 g Oral Daily  . sertraline  75 mg Oral Daily  . sodium chloride  1 g Oral QHS    Continuous Infusions:   LOS: 3 days     Darlin Drop, MD [facility] Pager [phone]  If 7PM-7AM, please contact night-coverage [email or web address] Password [facility] 10/13/2020, 3:44 PM

## 2020-10-13 NOTE — Progress Notes (Signed)
[facility] 2W09 [facility]) Hospital Liaison Note   Ms. Kaman is our current hospice patient with a terminal diagnosis of Alzheimer's Dementia with behavioral disturbance. Patient lives at [facility] in the memory care unit where she suffered a fall a month ago. She has apparently continued to bleed from the hematoma site from a month ago. EMS was activated, possible arrest in the field secondary to anemia, patient is admitted for management of anemia and chronic bleeding from hematoma.  Per Dr. Kirt Boys, [facility] MD, this is a related and covered admission.   Visited Ms. Nitta at the bedside. Pt resting, respirations even and unlabored. Pain med regime has been changed to address increased pain; interventions appear effective at this time as evidenced by patient resting comfortably.  Report exchanged with bedside nurse who shares that pt has had very little PO intake, concern that PO meds will be difficult soon.  MD made aware; new order for inpt [facility] Palliative team placed. Pt is appropriate for GIP level of care due to the recent medication changes to address severe pain and the need to monitor these for effectiveness.   V/S:  98.3 axillary, 125/65, 61 HR, 17 RR, 95% RA I&O: 240/450 Abnormal Labs: Ca 8.4, GFR 59, RBC 3.02, Hgb 8.7, RDW 17.2, Abs Immature Gran 0.14 IVs/PRNs: ultram 50 mg PO x 2 (discontinued), Oxycodone IR 5mg  q6h PRN severe pain x 2 in past 12 hours   Problem List: - ruptured hematoma while on chronic anticoagulation  -poor oral intake -moderate protein calorie malnutrition - acute blood loss on chronic normocytic anemia  - Alzheimers Dementia    GOC: DNR, possible discuss of comfort care appropriate if PO intake remains poor? D/C planning: Per , ACC RN CM, [facility] able to accept pt back once Barrie Lyme has been delivered and staff trained on its use  Family: updated Michiel Sites by phone IDT: hospice team updated   Once ready for d/c,  please use [facility] non-emergency transport as they contract this service for our active patients.    Thank you for the opportunity to participate in this patient's care.     [name], BSN, RN [facility] Liaison   [phone]

## 2020-10-13 NOTE — Care Management Important Message (Signed)
Important Message  Patient Details  Name: Sylvia Davila MRN: [id number] Date of Birth: 01-25-1927   Medicare Important Message Given:  Yes     Elyce Zollinger 10/13/2020, 2:40 PM

## 2020-10-13 NOTE — TOC Progression Note (Addendum)
Transition of Care [facility]) - Progression Note    Patient Details  Name: Sylvia Davila MRN: [id number] Date of Birth: 28-Sep-1926  [facility]) CM/SW Contact  Lorri Frederick, LCSW Phone Number: 10/13/2020, 9:16 AM  Clinical Narrative:   CSW spoke with Lisette Abu at [facility].  Due to pt requiring max help with ADLs, they are going to need to get a hoyer lift and train staff on this.  In addition, this will also lead to additional charges for the family.  Lisette Abu will speak with pt daughter about all this.  Hospice can order the hoyer lift.  It would be early next week before they could receive the pt back.  Pt sent out for SNF as back up plan as well.   1055: TC with pt daughter, [facility].  She spoke with [facility], is fine with the extra cost, and would like to work towards return there once everything is in place.    Expected Discharge Plan: Memory Care Barriers to Discharge: Other (comment) (facility not accepting pt back)  Expected Discharge Plan and Services Expected Discharge Plan: Memory Care     Post Acute Care Choice: Resumption of Svcs/PTA Provider Living arrangements for the past 2 months: Assisted Living Facility Expected Discharge Date: 10/13/20                                     Social Determinants of Health (SDOH) Interventions    Readmission Risk Interventions No flowsheet data found.

## 2020-10-13 NOTE — Plan of Care (Signed)

## 2020-10-13 NOTE — [place] FL2 (Signed)
[place] MEDICAID FL2 LEVEL OF CARE SCREENING TOOL     IDENTIFICATION  Patient Name: Sylvia Davila Birthdate: 1926-11-26 Sex: female Admission Date (Current Location): 10/13/2020  [facility] and [name] Number:  Producer, television/film/video and Address:  [facility] [place], [address] [place], [place] [address]      Provider Number: [id number]  Attending Physician Name and Address:  Darlin Drop, DO  Relative Name and Phone Number:  Reggy Eye Daughter   [phone]    Current Level of Care: Hospital Recommended Level of Care: Skilled Nursing Facility Prior Approval Number:    Date Approved/Denied:   PASRR Number: [id number]  Discharge Plan: SNF    Current Diagnoses: Patient Active Problem List   Diagnosis Date Noted  . Acute on chronic anemia 10/09/2020  . Hemorrhagic shock (HCC) 09/27/2020  . Elevated serum creatinine 10/13/2020  . Scalp hematoma 10/16/2020  . Atrial fibrillation (HCC)     Orientation RESPIRATION BLADDER Height & Weight     Self  Normal External catheter Weight: 151 lb 14.4 oz (68.9 kg) Height:  5\' 2"  (157.5 cm)  BEHAVIORAL SYMPTOMS/MOOD NEUROLOGICAL BOWEL NUTRITION STATUS      Continent Diet (DYS 3.  See discharge summary)  AMBULATORY STATUS COMMUNICATION OF NEEDS Skin   Total Care Verbally Other (Comment) (ecchymosis)                       Personal Care Assistance Level of Assistance  Bathing,Feeding,Dressing,Total care Bathing Assistance: Maximum assistance Feeding assistance: Maximum assistance Dressing Assistance: Maximum assistance Total Care Assistance: Maximum assistance   Functional Limitations Info  Sight,Hearing,Speech Sight Info: Adequate Hearing Info: Adequate Speech Info: Adequate    SPECIAL CARE FACTORS FREQUENCY  PT (By licensed PT),OT (By licensed OT)     PT Frequency: 5x week OT Frequency: 5x week            Contractures Contractures Info: Not present    Additional Factors Info   Code Status,Allergies Code Status Info: DNR Allergies Info: Meclizine, Penicillins           Current Medications (10/13/2020):  This is the current hospital active medication list Current Facility-Administered Medications  Medication Dose Route Frequency Provider Last Rate Last Admin  . acetaminophen (TYLENOL) tablet 650 mg  650 mg Oral Q6H PRN Howerter, Justin B, DO   650 mg at 10/11/20 1352   Or  . acetaminophen (TYLENOL) suppository 650 mg  650 mg Rectal Q6H PRN Howerter, Justin B, DO      . atenolol (TENORMIN) tablet 12.5 mg  12.5 mg Oral Daily [place], Carole N, DO   12.5 mg at 10/13/20 10/15/20  . atorvastatin (LIPITOR) tablet 20 mg  20 mg Oral Daily Dahal, 3007, MD   20 mg at 10/13/20 0918  . bacitracin ointment   Topical BID 10/15/20, Juliet Rude   Given at 10/13/20 10/15/20  . folic acid (FOLVITE) tablet 1 mg  1 mg Oral Daily Dahal, 6226, MD   1 mg at 10/13/20 0918  . LORazepam (ATIVAN) tablet 0.5 mg  0.5 mg Oral TID 10/15/20, MD   0.5 mg at 10/13/20 0918  . methotrexate (RHEUMATREX) tablet 10 mg  10 mg Oral Q Fri Hall, Carole N, DO      . pantoprazole (PROTONIX) EC tablet 20 mg  20 mg Oral Daily Dahal, 03-15-1997, MD   20 mg at 10/13/20 0918  . polyethylene glycol (MIRALAX / GLYCOLAX) packet 17 g  17  g Oral Daily Lorin Glass, MD   17 g at 10/13/20 0919  . prochlorperazine (COMPAZINE) tablet 10 mg  10 mg Oral Q8H PRN Dahal, Binaya, MD      . sertraline (ZOLOFT) tablet 75 mg  75 mg Oral Daily Dahal, Melina Schools, MD   75 mg at 10/13/20 0918  . sodium chloride tablet 1 g  1 g Oral QHS Dahal, Melina Schools, MD   1 g at 10/12/20 2133  . traMADol (ULTRAM) tablet 50 mg  50 mg Oral Q4H PRN Dow Adolph N, DO   50 mg at 10/13/20 6712     Discharge Medications: Please see discharge summary for a list of discharge medications.  Relevant Imaging Results:  Relevant Lab Results:   Additional Information SSN [id number]  Lorri Frederick, LCSW

## 2020-10-14 LAB — TYPE AND SCREEN
ABO/RH(D): A POS
Antibody Screen: POSITIVE
Donor AG Type: NEGATIVE
Donor AG Type: NEGATIVE
PT AG Type: NEGATIVE
Unit division: 0
Unit division: 0
Unit division: 0
Unit division: 0

## 2020-10-14 LAB — BPAM RBC
Blood Product Expiration Date: 202202022359
Blood Product Expiration Date: 202202072359
Blood Product Expiration Date: 202202082359
Blood Product Expiration Date: 202202082359
ISSUE DATE / TIME: 202201180210
ISSUE DATE / TIME: 202201180210
Unit Type and Rh: 5100
Unit Type and Rh: 5100
Unit Type and Rh: 6200
Unit Type and Rh: 6200

## 2020-10-14 NOTE — Progress Notes (Signed)
[facility] 2W09 [facility]) Hospital Liaison Note  Ms. Galster is our current hospice patient with a terminal diagnosis of Alzheimer's Dementia with behavioral disturbance. Patient lives at [facility] in the memory care unit where she suffered a fall a month ago. She has apparently continued to bleed from the hematoma site from a month ago. EMS was activated, possible arrest in the field secondary to anemia, patient is admitted for management of anemia and chronic bleeding from hematoma.  Per Dr. Kirt Boys, [facility] MD, this is a related and covered admission.  Visited patient at the bedside. She is alert but does not engage in conversation. She is moaning, but this subsided with repositioning her in the bed. Transition has been made towards full comfort measures.  Morphine infusion was initiated last night, currently at 1 mg/hr and there does not appear to have been any titrations with her infusion.  V/S: 97.7 oral, 140/110, HR 95, RR 20, SPO2 73% on RA IVs/PRNs: morphine infusion initiated on 1/21 at 7 pm, ativan 1 mg IV for anxiety within the last 12 hours  Problem List: - ruptured hematoma while on chronic anticoagulation - xarelto stopped, site sutured and it is no longer bleeding - acute blood loss on chronic normocytic anemia - transfused 1 unit PRBCs on 1/18, monitor hgb and HCT and transfuse if < 7, xarelto stopped. Now shifted towards full comfort measures and no further labwork is anticipated.  - Alzheimers Dementia - patient is alert only, does not respond. Initially moaning during my visit, but subsided with repositioning.  GOC: clear, full comfort measures D/C planning: hospital death anticipated IDT: hospice team updated Family: updated Judy via phone  Wallis Bamberg RN, BSN, CCRN [facility] Liaison

## 2020-10-14 NOTE — Progress Notes (Signed)
PMT progress note.  I was asked to do a quick symptom check.   The patient is sleeping soundly and peacefully.  No signs of distress.  No furrowed brow or work of breathing.  Lunch tray is untouched at bedside.   Patient's hematoma on right temple is oozing slight blood.  Morphine gtt at 1 mg per hour.  Patient appears very comfortable.  Norvel Richards, PA-C Palliative Medicine Office:  [phone]   No charge note.

## 2020-10-14 NOTE — Progress Notes (Signed)
PROGRESS NOTE  Sylvia Davila HYW:[id number] DOB: 09/14/1927 DOA: 10/09/2020 PCP: Patient, No Pcp Per  HPI/Recap of past 24 hours: Sylvia Davila is a 87 y.o. female with PMH significant for paroxysmal atrial fibrillation on Xarelto, hx of stroke, PE in November 2020, frequent falls, hypertension, dementia who lives at [facility] memory care.  On further review of chart and history, the patient's fall was actually about a month ago prior to admission. She presented to the [facility] ED 2 nights ago with bleeding from the scalp wound, at which time sutures were placed and the bleeding resolved. She was discharged to SNF. Per EMS report, the scalp hematoma "ruptured" again the night of her presentation and the patient was brought to the [facility] ED. She has had multiple ED visits for falls over the last few months and was admitted for syncope in August 2021.  Patient has DNR/DNI orders on her chart from SNF.  In route to ED, patient had a cardiac arrest.  ROSC obtained after 2 minutes of CPR.    Over the months, she declined physically and mentally. Since April 2021, she is in a memory care unit. For a month now, she has been under hospice care under [facility].   On arrival to the ED, patient was awake but had a low blood pressure of 82/40 with heart rate in 100s. Labs showed a hemoglobin of 7.3, creatinine elevated 1.48. INR 3.3 on Xarelto. Chest x-ray showed bibasilar opacities consistent with atelectasis. CT head did not show any acute intracranial process but showed soft tissue edema in the right frontal skull. In the ED, patient was seen by trauma surgery Dr. Freida Busman.  Previous suture removed and wound was sutured again. Admitted to hospitalist service.  Hospital course complicated by physical debility and intermittent sternal pain for which pain management is in place.  Patient was seen by PT OT with recommendation for SNF.   Patient had very poor oral intake and was refusing her  meals with feeding assistance.  Palliative care team was consulted.  Patient's daughter made decision for comfort care on 10/13/2020.   10/14/20: Seen in her room.  She has shallow breath and is non responsive on morphine drip.  Assessment/Plan: Principal Problem:   Acute on chronic anemia Active Problems:   Hemorrhagic shock (HCC)   Elevated serum creatinine   Scalp hematoma   Atrial fibrillation (HCC)  -Ruptured hematoma on right forehead, post suture, while on long-term anticoagulation, Xarelto, discontinued. -Poor oral intake -Failure to thrive in an adult. -Moderate protein calorie malnutrition -Dysphagia 3 -Postcardiac arrest and brief CPR in route -Acute blood loss on chronic normocytic anemia, hemoglobin is stable. -Supratherapeutic INR -Essential hypertension. -Paroxysmal A. Fib, rate controlled. -Resolved sinus bradycardia -Hx of stroke -Hx of DVT/PE -Resolved AKI -Chronic anxiety/chronic depression -Advanced Alzheimer's dementia with history of behavioral disturbance -Ambulatory dysfunction post recurrent falls -Goals of care -DNR -Comfort care measures.  Plan: Comfort focused care. She is currently on morphine drip started on 10/13/2020 by palliative care team. Likely inpatient death.   Code Status:  Code Status: DNR  Nutritional status: Pleasure feedings.  DVT prophylaxis: Comfort care only.   Antimicrobials: None Fluid: Not needed Consultants: Trauma surgery Family Communication:  Updated her daughter via phone on October 12, 2020 and on 10/13/2020.    Status is: Inpatient    Dispo:  Patient From: Skilled Nursing Facility  Planned Disposition: Residential Hospice  Expected discharge date: 10/13/20  Medically stable for discharge:No, likely hospital death  Objective: Vitals:   10/13/20 1500 10/13/20 1537 10/13/20 2133 10/14/20 1257  BP: (!) 180/154 132/73 (!) 137/115 (!) 140/110  Pulse: 63 63 83 95  Resp: 17 19 16 20    Temp: 98.7 F (37.1 C) 98.7 F (37.1 C) 97.7 F (36.5 C) 97.7 F (36.5 C)  TempSrc: Axillary Axillary Axillary   SpO2: 96% 100% (!) 85% (!) 73%  Weight:      Height:        Intake/Output Summary (Last 24 hours) at 10/14/2020 1354 Last data filed at 10/14/2020 2951 Gross per 24 hour  Intake 9.35 ml  Output 575 ml  Net -565.65 ml   Filed Weights   10/03/2020 0324 10/11/20 0604 10/12/20 0526  Weight: 64.4 kg 69 kg 68.9 kg    Exam:  . General: 87 y.o. year-old female chronically ill-appearing.  Unresponsive.   . Cardiovascular: Regular rate and rhythm no rubs gallops.  Marland Kitchen Respiratory: Shallow breath. . Abdomen: Obese nontender hypoactive bowel sounds present. . Musculoskeletal: Trace lower extremity edema bilaterally.  Marland Kitchen Psychiatry: Mood is appropriate for condition and setting.   Data Reviewed: CBC: Recent Labs  Lab 10/21/2020 0218 09/28/2020 0858 10/11/20 0537 10/12/20 0705 10/13/20 0312  WBC 11.8*  --  8.7 7.8 6.8  NEUTROABS 7.3  --  6.0 4.9 4.1  HGB 7.3* 10.1* 8.8* 8.5* 8.7*  HCT 24.9* 30.6* 28.1* 27.7* 28.8*  MCV 95.4  --  92.1 94.2 95.4  PLT 302  --  220 221 212   Basic Metabolic Panel: Recent Labs  Lab 09/27/2020 0218 10/11/20 0537 10/12/20 0705 10/13/20 0312  NA 144 144 147* 145  K 3.5 3.6 4.0 4.1  CL 109 113* 114* 112*  CO2 24 23 26 26   GLUCOSE 192* 99 110* 99  BUN 27* 16 16 12   CREATININE 1.48* 0.84 0.99 0.92  CALCIUM 8.4* 8.3* 8.6* 8.4*   GFR: Estimated Creatinine Clearance: 36.2 mL/min (by C-G formula based on SCr of 0.92 mg/dL). Liver Function Tests: Recent Labs  Lab 10/01/2020 0218  AST 18  ALT 17  ALKPHOS 75  BILITOT 0.7  PROT 5.4*  ALBUMIN 2.3*   No results for input(s): LIPASE, AMYLASE in the last 168 hours. No results for input(s): AMMONIA in the last 168 hours. Coagulation Profile: Recent Labs  Lab 10/18/2020 0218  INR 3.3*   Cardiac Enzymes: No results for input(s): CKTOTAL, CKMB, CKMBINDEX, TROPONINI in the last 168  hours. BNP (last 3 results) No results for input(s): PROBNP in the last 8760 hours. HbA1C: No results for input(s): HGBA1C in the last 72 hours. CBG: No results for input(s): GLUCAP in the last 168 hours. Lipid Profile: No results for input(s): CHOL, HDL, LDLCALC, TRIG, CHOLHDL, LDLDIRECT in the last 72 hours. Thyroid Function Tests: No results for input(s): TSH, T4TOTAL, FREET4, T3FREE, THYROIDAB in the last 72 hours. Anemia Panel: No results for input(s): VITAMINB12, FOLATE, FERRITIN, TIBC, IRON, RETICCTPCT in the last 72 hours. Urine analysis: No results found for: COLORURINE, APPEARANCEUR, LABSPEC, PHURINE, GLUCOSEU, HGBUR, BILIRUBINUR, KETONESUR, PROTEINUR, UROBILINOGEN, NITRITE, LEUKOCYTESUR Sepsis Labs: @LABRCNTIP (procalcitonin:4,lacticidven:4)  ) Recent Results (from the past 240 hour(s))  Resp Panel by RT-PCR (Flu A&B, Covid) Nasopharyngeal Swab     Status: None   Collection Time: 09/23/2020  2:15 AM   Specimen: Nasopharyngeal Swab; Nasopharyngeal(NP) swabs in vial transport medium  Result Value Ref Range Status   SARS Coronavirus 2 by RT PCR NEGATIVE NEGATIVE Final    Comment: (NOTE) SARS-CoV-2 target nucleic acids are NOT DETECTED.  The  SARS-CoV-2 RNA is generally detectable in upper respiratory specimens during the acute phase of infection. The lowest concentration of SARS-CoV-2 viral copies this assay can detect is 138 copies/mL. A negative result does not preclude SARS-Cov-2 infection and should not be used as the sole basis for treatment or other patient management decisions. A negative result may occur with  improper specimen collection/handling, submission of specimen other than nasopharyngeal swab, presence of viral mutation(s) within the areas targeted by this assay, and inadequate number of viral copies(<138 copies/mL). A negative result must be combined with clinical observations, patient history, and epidemiological information. The expected result is  Negative.  Fact Sheet for Patients:  BloggerCourse.com  Fact Sheet for Healthcare Providers:  SeriousBroker.it  This test is no t yet approved or cleared by the [place] FDA and  has been authorized for detection and/or diagnosis of SARS-CoV-2 by FDA under an Emergency Use Authorization (EUA). This EUA will remain  in effect (meaning this test can be used) for the duration of the COVID-19 declaration under Section 564(b)(1) of the Act, 21 U.S.C.section 360bbb-3(b)(1), unless the authorization is terminated  or revoked sooner.       Influenza A by PCR NEGATIVE NEGATIVE Final   Influenza B by PCR NEGATIVE NEGATIVE Final    Comment: (NOTE) The Xpert Xpress SARS-CoV-2/FLU/RSV plus assay is intended as an aid in the diagnosis of influenza from Nasopharyngeal swab specimens and should not be used as a sole basis for treatment. Nasal washings and aspirates are unacceptable for Xpert Xpress SARS-CoV-2/FLU/RSV testing.  Fact Sheet for Patients: BloggerCourse.com  Fact Sheet for Healthcare Providers: SeriousBroker.it  This test is not yet approved or cleared by the [place] FDA and has been authorized for detection and/or diagnosis of SARS-CoV-2 by FDA under an Emergency Use Authorization (EUA). This EUA will remain in effect (meaning this test can be used) for the duration of the COVID-19 declaration under Section 564(b)(1) of the Act, 21 U.S.C. section 360bbb-3(b)(1), unless the authorization is terminated or revoked.  Performed at [facility], [address] [place], [place] [address]       Studies: No results found.  Scheduled Meds: . bacitracin   Topical BID  . feeding supplement  237 mL Oral BID BM  . glycopyrrolate  0.4 mg Intravenous Q4H  . LORazepam  0.5 mg Intravenous TID    Continuous Infusions: . morphine 1 mg/hr (10/13/20 1833)     LOS: 4  days     Darlin Drop, MD [facility] Pager [phone]  If 7PM-7AM, please contact night-coverage [email or web address] Password [facility] 10/14/2020, 1:54 PM

## 2020-10-15 DIAGNOSIS — Z515 Encounter for palliative care: Secondary | ICD-10-CM

## 2020-10-15 DIAGNOSIS — T148XXA Other injury of unspecified body region, initial encounter: Secondary | ICD-10-CM

## 2020-10-15 NOTE — Progress Notes (Signed)
                                                                                                                                                                                                           Daily Progress Note   Patient Name: Sylvia Davila       Date: 10/15/2020 DOB: 1927/06/23  Age: 87 y.o. MRN#: [id number] Attending Physician: Darlin Drop, DO Primary Care Physician: Patient, No Pcp Per Admit Date: October 12, 2020  Reason for Consultation/Follow-up: Non pain symptom management    Patent resting comfortably.  Sound asleep. She does not wake to my voice or brief exam.  Heart rate slightly rapid.  Arms edematous.  Temples sunken.   Would benefit from oral care. Tray at bedside untouched.  Assessment: Patient comfortable.  In the dying process.  Does not appear to be waking on very low dose morphine gtt.  Being actively followed by [facility].  Length of Stay: 5   Vital Signs: BP 137/86 (BP Location: Right Arm)   Pulse 95   Temp 97.9 F (36.6 C) (Axillary)   Resp 18   Ht 5\' 2"  (1.575 m)   Wt 68.9 kg   SpO2 (!) 87%   BMI 27.78 kg/m  SpO2: SpO2: (!) 87 % O2 Device: O2 Device: Room Air O2 Flow Rate:         Palliative Assessment/Data: 10%     Palliative Care Plan    Recommendations/Plan:  Will DC food tray and request comfort feeds if she wakes.  Will request more frequent oral care - but do hate to disturb such a peaceful looking sleep.  Will increase morphine gtt to 2 mg / hour give rapid heart rate.  Want to ensure she feels relaxed even if she is not awake to demonstrate pain or anxiety.  Code Status:  DNR  Prognosis:   Likely days to perhaps a week.  Discharge Planning:  Anticipated Hospital Death  Thank you for allowing the Palliative Medicine Team to assist in the care of this patient.  Total time spent:  15 min.     Greater than 50%  of this time was spent counseling and coordinating care related to the above assessment and  plan.  , PA-C Palliative Medicine  Please contact Palliative MedicineTeam phone at [phone] for questions and concerns between 7 am - 7 pm.   Please see AMION for individual provider pager numbers.

## 2020-10-15 NOTE — Progress Notes (Signed)
PROGRESS NOTE   CWC:[id number] DOB: 18-Aug-1927 DOA: 2020/10/18 PCP: Patient, No Pcp Per  HPI/Recap of past 24 hours: Sylvia Davila is a 87 y.o. female with PMH significant for paroxysmal atrial fibrillation on Xarelto, hx of stroke, PE in November 2020, frequent falls, hypertension, dementia who lives at [facility] memory care.  On further review of chart and history, the patient's fall was actually about a month ago prior to admission. She presented to the [facility] ED 2 nights ago with bleeding from the scalp wound, at which time sutures were placed and the bleeding resolved. She was discharged to SNF. Per EMS report, the scalp hematoma "ruptured" again the night of her presentation and the patient was brought to the [facility] ED. She has had multiple ED visits for falls over the last few months and was admitted for syncope in August 2021.  Patient has DNR/DNI orders on her chart from SNF.  In route to ED, patient had a cardiac arrest.  ROSC obtained after 2 minutes of CPR.    Over the months, she declined physically and mentally. Since April 2021, she is in a memory care unit. For a month now, she has been under hospice care under [facility].   On arrival to the ED, patient was awake but had a low blood pressure of 82/40 with heart rate in 100s. Labs showed a hemoglobin of 7.3, creatinine elevated 1.48. INR 3.3 on Xarelto. Chest x-ray showed bibasilar opacities consistent with atelectasis. CT head did not show any acute intracranial process but showed soft tissue edema in the right frontal skull. In the ED, patient was seen by trauma surgery Dr. Freida Busman.  Previous suture removed and wound was sutured again. Admitted to hospitalist service.  Hospital course complicated by physical debility and intermittent sternal pain for which pain management is in place.  Patient was seen by PT OT with recommendation for SNF.   Patient had very poor oral intake and was refusing her  meals with feeding assistance.  Palliative care team was consulted.  Patient's daughter made decision for comfort care on 10/13/2020.   10/15/20: Shallow breath noted.  No signs of acute distress.  Currently on morphine drip.  Likely hospital death.  Assessment/Plan: Principal Problem:   Acute on chronic anemia Active Problems:   Hemorrhagic shock (HCC)   Elevated serum creatinine   Scalp hematoma   Atrial fibrillation (HCC)   Bleeding from wound   Terminal care   Palliative care encounter  -Ruptured hematoma on right forehead, post suture, while on long-term anticoagulation, Xarelto, discontinued. -Poor oral intake -Failure to thrive in an adult. -Moderate protein calorie malnutrition -Dysphagia 3 -Postcardiac arrest and brief CPR in route -Acute blood loss on chronic normocytic anemia, hemoglobin is stable. -Supratherapeutic INR -Essential hypertension. -Paroxysmal A. Fib, rate controlled. -Resolved sinus bradycardia -Hx of stroke -Hx of DVT/PE -Resolved AKI -Chronic anxiety/chronic depression -Advanced Alzheimer's dementia with history of behavioral disturbance -Ambulatory dysfunction post recurrent falls -Goals of care -DNR -Comfort care measures.  Plan: Comfort focused care. She is currently on morphine drip started on 10/13/2020 by palliative care team. Likely hospital death.   Code Status:  Code Status: DNR  Nutritional status: Pleasure feedings.  DVT prophylaxis: Comfort care only.   Antimicrobials: None Fluid: Not needed Consultants: Trauma surgery Family Communication:  Updated her daughter via phone on October 12, 2020 and on 10/13/2020.    Status is: Inpatient    Dispo:  Patient From: Skilled Nursing Facility  Planned Disposition:  Residential Hospice  Expected discharge date: 10/15/20  Medically stable for discharge:No, likely hospital death        Objective: Vitals:   10/13/20 1537 10/13/20 2133 10/14/20 1257 10/14/20 1932  BP:  132/73 (!) 137/115 (!) 140/110 137/86  Pulse: 63 83 95 95  Resp: 19 16 20 18   Temp: 98.7 F (37.1 C) 97.7 F (36.5 C) 97.7 F (36.5 C) 97.9 F (36.6 C)  TempSrc: Axillary Axillary  Axillary  SpO2: 100% (!) 85% (!) 73% (!) 87%  Weight:      Height:       No intake or output data in the 24 hours ending 10/15/20 1613 Filed Weights   10/03/2020 0324 10/11/20 0604 10/12/20 0526  Weight: 64.4 kg 69 kg 68.9 kg    Exam: No significant change from prior exam.  . General: 87 y.o. year-old female chronically ill-appearing.  Unresponsive.   . Cardiovascular: Regular rate and rhythm no rubs gallops.  82 Respiratory: Shallow breath. . Abdomen: Obese nontender hypoactive bowel sounds present. . Musculoskeletal: Trace lower extremity edema bilaterally.  Marland Kitchen Psychiatry: Mood is appropriate for condition and setting.   Data Reviewed: CBC: Recent Labs  Lab 10/14/2020 0218 10/12/2020 0858 10/11/20 0537 10/12/20 0705 10/13/20 0312  WBC 11.8*  --  8.7 7.8 6.8  NEUTROABS 7.3  --  6.0 4.9 4.1  HGB 7.3* 10.1* 8.8* 8.5* 8.7*  HCT 24.9* 30.6* 28.1* 27.7* 28.8*  MCV 95.4  --  92.1 94.2 95.4  PLT 302  --  220 221 212   Basic Metabolic Panel: Recent Labs  Lab 10/03/2020 0218 10/11/20 0537 10/12/20 0705 10/13/20 0312  NA 144 144 147* 145  K 3.5 3.6 4.0 4.1  CL 109 113* 114* 112*  CO2 24 23 26 26   GLUCOSE 192* 99 110* 99  BUN 27* 16 16 12   CREATININE 1.48* 0.84 0.99 0.92  CALCIUM 8.4* 8.3* 8.6* 8.4*   GFR: Estimated Creatinine Clearance: 36.2 mL/min (by C-G formula based on SCr of 0.92 mg/dL). Liver Function Tests: Recent Labs  Lab 09/30/2020 0218  AST 18  ALT 17  ALKPHOS 75  BILITOT 0.7  PROT 5.4*  ALBUMIN 2.3*   No results for input(s): LIPASE, AMYLASE in the last 168 hours. No results for input(s): AMMONIA in the last 168 hours. Coagulation Profile: Recent Labs  Lab 09/28/2020 0218  INR 3.3*   Cardiac Enzymes: No results for input(s): CKTOTAL, CKMB, CKMBINDEX, TROPONINI in the  last 168 hours. BNP (last 3 results) No results for input(s): PROBNP in the last 8760 hours. HbA1C: No results for input(s): HGBA1C in the last 72 hours. CBG: No results for input(s): GLUCAP in the last 168 hours. Lipid Profile: No results for input(s): CHOL, HDL, LDLCALC, TRIG, CHOLHDL, LDLDIRECT in the last 72 hours. Thyroid Function Tests: No results for input(s): TSH, T4TOTAL, FREET4, T3FREE, THYROIDAB in the last 72 hours. Anemia Panel: No results for input(s): VITAMINB12, FOLATE, FERRITIN, TIBC, IRON, RETICCTPCT in the last 72 hours. Urine analysis: No results found for: COLORURINE, APPEARANCEUR, LABSPEC, PHURINE, GLUCOSEU, HGBUR, BILIRUBINUR, KETONESUR, PROTEINUR, UROBILINOGEN, NITRITE, LEUKOCYTESUR Sepsis Labs: @LABRCNTIP (procalcitonin:4,lacticidven:4)  ) Recent Results (from the past 240 hour(s))  Resp Panel by RT-PCR (Flu A&B, Covid) Nasopharyngeal Swab     Status: None   Collection Time: 09/23/2020  2:15 AM   Specimen: Nasopharyngeal Swab; Nasopharyngeal(NP) swabs in vial transport medium  Result Value Ref Range Status   SARS Coronavirus 2 by RT PCR NEGATIVE NEGATIVE Final    Comment: (NOTE) SARS-CoV-2 target  nucleic acids are NOT DETECTED.  The SARS-CoV-2 RNA is generally detectable in upper respiratory specimens during the acute phase of infection. The lowest concentration of SARS-CoV-2 viral copies this assay can detect is 138 copies/mL. A negative result does not preclude SARS-Cov-2 infection and should not be used as the sole basis for treatment or other patient management decisions. A negative result may occur with  improper specimen collection/handling, submission of specimen other than nasopharyngeal swab, presence of viral mutation(s) within the areas targeted by this assay, and inadequate number of viral copies(<138 copies/mL). A negative result must be combined with clinical observations, patient history, and epidemiological information. The expected result  is Negative.  Fact Sheet for Patients:  BloggerCourse.com  Fact Sheet for Healthcare Providers:  SeriousBroker.it  This test is no t yet approved or cleared by the [place] FDA and  has been authorized for detection and/or diagnosis of SARS-CoV-2 by FDA under an Emergency Use Authorization (EUA). This EUA will remain  in effect (meaning this test can be used) for the duration of the COVID-19 declaration under Section 564(b)(1) of the Act, 21 U.S.C.section 360bbb-3(b)(1), unless the authorization is terminated  or revoked sooner.       Influenza A by PCR NEGATIVE NEGATIVE Final   Influenza B by PCR NEGATIVE NEGATIVE Final    Comment: (NOTE) The Xpert Xpress SARS-CoV-2/FLU/RSV plus assay is intended as an aid in the diagnosis of influenza from Nasopharyngeal swab specimens and should not be used as a sole basis for treatment. Nasal washings and aspirates are unacceptable for Xpert Xpress SARS-CoV-2/FLU/RSV testing.  Fact Sheet for Patients: BloggerCourse.com  Fact Sheet for Healthcare Providers: SeriousBroker.it  This test is not yet approved or cleared by the [place] FDA and has been authorized for detection and/or diagnosis of SARS-CoV-2 by FDA under an Emergency Use Authorization (EUA). This EUA will remain in effect (meaning this test can be used) for the duration of the COVID-19 declaration under Section 564(b)(1) of the Act, 21 U.S.C. section 360bbb-3(b)(1), unless the authorization is terminated or revoked.  Performed at [facility], [address], [place], [place] [address]       Studies: No results found.  Scheduled Meds: . bacitracin   Topical BID  . glycopyrrolate  0.4 mg Intravenous Q4H  . LORazepam  0.5 mg Intravenous TID    Continuous Infusions: . morphine 2 mg/hr (10/15/20 1218)     LOS: 5 days     Darlin Drop, MD [facility] Pager [phone]  If 7PM-7AM, please contact night-coverage [email or web address] Password [facility] 10/15/2020, 4:13 PM

## 2020-10-15 NOTE — Progress Notes (Signed)
[facility] 2W09 [facility]) Hospital Liaison Note  Ms. Vandergriff is our current hospice patient with a terminal diagnosis of Alzheimer's Dementia with behavioral disturbance. Patient lives at [place] in the memory care unit where she suffered a fall a month ago. She has apparently continued to bleed from the hematoma site from a month ago. EMS was activated, possible arrest in the field secondary to anemia, patient is admitted for management of anemia and chronic bleeding from hematoma. Per Dr. Kirt Boys, [facility] MD, this is a related and covered admission.  Visited patient at bedside. She did not respond to voice or touch, sleeping. Looks comfortable and no apparent distress. Transition has been made towards full comfort measures.  VS: None today. 1/22: 97.9, 137/86, 95, 18, 87%RA IVs/PRNs: morphine infusion 2mg /hr,  no PRNs  Problem List: - ruptured hematoma while on chronic anticoagulation - xarelto stopped, site sutured and it is no longer bleeding - acute blood loss on chronic normocytic anemia - transfused 1 unit PRBCs on 1/18, monitor hgb and HCT and transfuse if < 7, xarelto stopped. Now shifted towards full comfort measures and no further labwork is anticipated.  - Alzheimers Dementia - patient is alert only, does not respond. Initially moaning during my visit, but subsided with repositioning.  GOC: clear, full comfort measures D/C planning: hospital death anticipated IDT: hospice team updated Family: updated Judy via phone  A Please do not hesitate to call with questions.    Thank you,    2/18, RN, [facility]       [facility] Liaison (listed on [facility] under [facility]osp[facility])     [phone]

## 2020-10-16 NOTE — Progress Notes (Signed)
PROGRESS NOTE  [place] GGE:[id number] DOB: 1926/11/14 DOA: 11/08/2020 PCP: Patient, No Pcp Per  HPI/Recap of past 24 hours: Alexxus Sobh is a 87 y.o. female with PMH significant for paroxysmal atrial fibrillation on Xarelto, hx of stroke, PE in November 2020, frequent falls, hypertension, dementia who lives at [facility] memory care.  On further review of chart and history, the patient's fall was actually about a month ago prior to admission. She presented to the [facility] ED 2 nights ago with bleeding from the scalp wound, at which time sutures were placed and the bleeding resolved. She was discharged to SNF. Per EMS report, the scalp hematoma "ruptured" again the night of her presentation and the patient was brought to the [facility] ED. She has had multiple ED visits for falls over the last few months and was admitted for syncope in August 2021.  Patient has DNR/DNI orders on her chart from SNF.  In route to ED, patient had a cardiac arrest.  ROSC obtained after 2 minutes of CPR.    Over the months, she declined physically and mentally. Since April 2021, she is in a memory care unit. For a month now, she has been under hospice care under [facility].   On arrival to the ED, patient was awake but had a low blood pressure of 82/40 with heart rate in 100s. Labs showed a hemoglobin of 7.3, creatinine elevated 1.48. INR 3.3 on Xarelto. Chest x-ray showed bibasilar opacities consistent with atelectasis. CT head did not show any acute intracranial process but showed soft tissue edema in the right frontal skull. In the ED, patient was seen by trauma surgery Dr. Freida Busman.  Previous suture removed and wound was sutured again. Admitted to hospitalist service.  Hospital course complicated by physical debility and intermittent sternal pain for which pain management is in place.  Patient was seen by PT OT with recommendation for SNF.   Patient had very poor oral intake and was refusing her  meals with feeding assistance.  Palliative care team was consulted.  Patient's daughter made decision for comfort care on 10/13/2020.   10/16/20: Shallow breath noted.  No signs of acute distress.  Currently on morphine drip.  Likely hospital death.  Assessment/Plan: Principal Problem:   Acute on chronic anemia Active Problems:   Hemorrhagic shock (HCC)   Elevated serum creatinine   Scalp hematoma   Atrial fibrillation (HCC)   Bleeding from wound   Terminal care   Palliative care encounter  -Ruptured hematoma on right forehead, post suture, while on long-term anticoagulation, Xarelto, discontinued. -Poor oral intake -Failure to thrive in an adult. -Moderate protein calorie malnutrition -Dysphagia 3 -Postcardiac arrest and brief CPR in route -Acute blood loss on chronic normocytic anemia, hemoglobin is stable. -Supratherapeutic INR -Essential hypertension. -Paroxysmal A. Fib, rate controlled. -Resolved sinus bradycardia -Hx of stroke -Hx of DVT/PE -Resolved AKI -Chronic anxiety/chronic depression -Advanced Alzheimer's dementia with history of behavioral disturbance -Ambulatory dysfunction post recurrent falls -Goals of care -DNR -Comfort care measures.  Plan: Comfort focused care. She is currently on morphine drip started on 10/13/2020 by palliative care team. Likely hospital death.   Code Status:  Code Status: DNR  Nutritional status: Pleasure feedings.  DVT prophylaxis: Comfort care only.   Antimicrobials: None Fluid: Not needed Consultants: Trauma surgery Family Communication:  Updated her daughter via phone on October 12, 2020 and on 10/13/2020.    Status is: Inpatient    Dispo:  Patient From: Skilled Nursing Facility  Planned Disposition:  Residential Hospice  Expected discharge date: 10/16/20  Medically stable for discharge:No, likely hospital death        Objective: Vitals:   10/14/20 1257 10/14/20 1932 10/15/20 1819 10/15/20 2000  BP:  (!) 140/110 137/86 (!) 133/55 (!) 120/57  Pulse: 95 95 85 85  Resp: 20 18 13 18   Temp: 97.7 F (36.5 C) 97.9 F (36.6 C) 98.2 F (36.8 C) 97.6 F (36.4 C)  TempSrc:  Axillary Oral Axillary  SpO2: (!) 73% (!) 87% 96% 94%  Weight:      Height:        Intake/Output Summary (Last 24 hours) at 10/16/2020 1500 Last data filed at 10/16/2020 1400 Gross per 24 hour  Intake 86.66 ml  Output 250 ml  Net -163.34 ml   Filed Weights   10/20/2020 0324 10/11/20 0604 10/12/20 0526  Weight: 64.4 kg 69 kg 68.9 kg    Exam: No significant change from prior exam.   General: 87 y.o. year-old female chronically ill-appearing.  Unresponsive.    Cardiovascular: Regular rate and rhythm no rubs gallops.   Respiratory: Shallow breath.  Abdomen: Obese nontender hypoactive bowel sounds present.  Musculoskeletal: Trace lower extremity edema bilaterally.   Psychiatry: Mood is appropriate for condition and setting.   Data Reviewed: CBC: Recent Labs  Lab 10/02/2020 0218 10/09/2020 0858 10/11/20 0537 10/12/20 0705 10/13/20 0312  WBC 11.8*  --  8.7 7.8 6.8  NEUTROABS 7.3  --  6.0 4.9 4.1  HGB 7.3* 10.1* 8.8* 8.5* 8.7*  HCT 24.9* 30.6* 28.1* 27.7* 28.8*  MCV 95.4  --  92.1 94.2 95.4  PLT 302  --  220 221 212   Basic Metabolic Panel: Recent Labs  Lab 10/21/2020 0218 10/11/20 0537 10/12/20 0705 10/13/20 0312  NA 144 144 147* 145  K 3.5 3.6 4.0 4.1  CL 109 113* 114* 112*  CO2 24 23 26 26   GLUCOSE 192* 99 110* 99  BUN 27* 16 16 12   CREATININE 1.48* 0.84 0.99 0.92  CALCIUM 8.4* 8.3* 8.6* 8.4*   GFR: Estimated Creatinine Clearance: 36.2 mL/min (by C-G formula based on SCr of 0.92 mg/dL). Liver Function Tests: Recent Labs  Lab 10/20/2020 0218  AST 18  ALT 17  ALKPHOS 75  BILITOT 0.7  PROT 5.4*  ALBUMIN 2.3*   No results for input(s): LIPASE, AMYLASE in the last 168 hours. No results for input(s): AMMONIA in the last 168 hours. Coagulation Profile: Recent Labs  Lab 10/16/2020 0218   INR 3.3*   Cardiac Enzymes: No results for input(s): CKTOTAL, CKMB, CKMBINDEX, TROPONINI in the last 168 hours. BNP (last 3 results) No results for input(s): PROBNP in the last 8760 hours. HbA1C: No results for input(s): HGBA1C in the last 72 hours. CBG: No results for input(s): GLUCAP in the last 168 hours. Lipid Profile: No results for input(s): CHOL, HDL, LDLCALC, TRIG, CHOLHDL, LDLDIRECT in the last 72 hours. Thyroid Function Tests: No results for input(s): TSH, T4TOTAL, FREET4, T3FREE, THYROIDAB in the last 72 hours. Anemia Panel: No results for input(s): VITAMINB12, FOLATE, FERRITIN, TIBC, IRON, RETICCTPCT in the last 72 hours. Urine analysis: No results found for: COLORURINE, APPEARANCEUR, LABSPEC, PHURINE, GLUCOSEU, HGBUR, BILIRUBINUR, KETONESUR, PROTEINUR, UROBILINOGEN, NITRITE, LEUKOCYTESUR Sepsis Labs: @LABRCNTIP (procalcitonin:4,lacticidven:4)  ) Recent Results (from the past 240 hour(s))  Resp Panel by RT-PCR (Flu A&B, Covid) Nasopharyngeal Swab     Status: None   Collection Time: 09/26/2020  2:15 AM   Specimen: Nasopharyngeal Swab; Nasopharyngeal(NP) swabs in vial transport medium  Result Value  Ref Range Status   SARS Coronavirus 2 by RT PCR NEGATIVE NEGATIVE Final    Comment: (NOTE) SARS-CoV-2 target nucleic acids are NOT DETECTED.  The SARS-CoV-2 RNA is generally detectable in upper respiratory specimens during the acute phase of infection. The lowest concentration of SARS-CoV-2 viral copies this assay can detect is 138 copies/mL. A negative result does not preclude SARS-Cov-2 infection and should not be used as the sole basis for treatment or other patient management decisions. A negative result may occur with  improper specimen collection/handling, submission of specimen other than nasopharyngeal swab, presence of viral mutation(s) within the areas targeted by this assay, and inadequate number of viral copies(<138 copies/mL). A negative result must be combined  with clinical observations, patient history, and epidemiological information. The expected result is Negative.  Fact Sheet for Patients:  BloggerCourse.com  Fact Sheet for Healthcare Providers:  SeriousBroker.it  This test is no t yet approved or cleared by the [place] FDA and  has been authorized for detection and/or diagnosis of SARS-CoV-2 by FDA under an Emergency Use Authorization (EUA). This EUA will remain  in effect (meaning this test can be used) for the duration of the COVID-19 declaration under Section 564(b)(1) of the Act, 21 U.S.C.section 360bbb-3(b)(1), unless the authorization is terminated  or revoked sooner.       Influenza A by PCR NEGATIVE NEGATIVE Final   Influenza B by PCR NEGATIVE NEGATIVE Final    Comment: (NOTE) The Xpert Xpress SARS-CoV-2/FLU/RSV plus assay is intended as an aid in the diagnosis of influenza from Nasopharyngeal swab specimens and should not be used as a sole basis for treatment. Nasal washings and aspirates are unacceptable for Xpert Xpress SARS-CoV-2/FLU/RSV testing.  Fact Sheet for Patients: BloggerCourse.com  Fact Sheet for Healthcare Providers: SeriousBroker.it  This test is not yet approved or cleared by the [place] FDA and has been authorized for detection and/or diagnosis of SARS-CoV-2 by FDA under an Emergency Use Authorization (EUA). This EUA will remain in effect (meaning this test can be used) for the duration of the COVID-19 declaration under Section 564(b)(1) of the Act, 21 U.S.C. section 360bbb-3(b)(1), unless the authorization is terminated or revoked.  Performed at [facility], [address] [place], [place] [address]       Studies: No results found.  Scheduled Meds:  bacitracin   Topical BID   glycopyrrolate  0.4 mg Intravenous Q4H   LORazepam  0.5 mg Intravenous TID    Continuous  Infusions:  morphine 2 mg/hr (10/16/20 1400)     LOS: 6 days     Darlin Drop, MD [facility] Pager [phone]  If 7PM-7AM, please contact night-coverage [email or web address] Password [email or web address] 10/16/2020, 3:00 PM

## 2020-10-16 NOTE — TOC Progression Note (Signed)
Transition of Care [facility]) - Progression Note    Patient Details  Name: Sylvia Davila MRN: [id number] Date of Birth: 10/10/1926  [facility] of [facility]) CM/SW Contact  Lorri Frederick, LCSW Phone Number: 10/16/2020, 9:58 AM  Clinical Narrative:   CSW spoke with Lisette Abu at [facility] and informed her of pt change in status and that hospital death is now anticipated.     Expected Discharge Plan: Memory Care Barriers to Discharge: Other (comment) (facility not accepting pt back)  Expected Discharge Plan and Services Expected Discharge Plan: Memory Care     Post Acute Care Choice: Resumption of Svcs/PTA Provider Living arrangements for the past 2 months: Assisted Living Facility Expected Discharge Date: 10/13/20                                     Social Determinants of Health (SDOH) Interventions    Readmission Risk Interventions No flowsheet data found.

## 2020-10-16 NOTE — Progress Notes (Signed)
Patient resting with eyes closed and no signs of discomfort or distress. Breathing 14 times in 30 seconds with periods of 30 seconds of apnea.

## 2020-10-16 NOTE — Progress Notes (Signed)
Bag of Morphine sulfate infusion totally empty . Bag in sharps as witnessed by [facility] LPN.

## 2020-10-16 NOTE — Progress Notes (Signed)
[facility] 2W09 [facility]) Hospital Liaison Note   Sylvia Davila is our current hospice patient with a terminal diagnosis of Alzheimer's Dementia with behavioral disturbance. Patient lives at [facility] in the memory care unit where she suffered a fall a month ago. She has apparently continued to bleed from the hematoma site from a month ago. EMS was activated, possible arrest in the field secondary to anemia, patient is admitted for management of anemia and chronic bleeding from hematoma.  Per Dr. Kirt Boys, [facility] MD, this is a related and covered admission.  Visited patient at bedside. She did not respond to voice or touch, sleeping. Resp rate 5-6/minute, slow and even, no signs of distress or air hunger.  No motling or cyanosis noted; bilateral hand and BLE +3 edema noted.  Facial expression relaxed, body relaxed, no signs of nonverbal pain. Transition has been made towards full comfort measures. Patient is appropriate for GIP level of care due to recent adjustment of IV meds to managed anxiety and pain at end of life.  Close monitoring is required to reassess efficacy of med regime. VS: 97.6 axillary, 120/57, 85 HR, 5 RR, 94% RA. IVs/PRNs: morphine infusion 2mg /hr, morphine 2mg  q76minutes PIV PRN severe breakthrough pain, robinul 0.4 mg PIV q4h, Ativan 0.5 mg TID PIV Problem List: - ruptured hematoma while on chronic anticoagulation - xarelto stopped, site sutured and it is no longer bleeding - acute blood loss on chronic normocytic anemia - transfused 1 unit PRBCs on 1/18, monitor hgb and HCT and transfuse if < 7, xarelto stopped. Now shifted towards full comfort measures and no further labwork is anticipated.  - Alzheimers Dementia - patient is alert only, does not respond. Initially moaning during my visit, but subsided with repositioning.   GOC: clear, full comfort measures D/C planning: hospital death anticipated IDT: hospice team updated Family: updated 31m via  phone  Thank you for the opportunity to participate in this patient's care.     2/18, BSN, RN [facility] Liaison   [phone]

## 2020-10-16 NOTE — Progress Notes (Addendum)
° °  Palliative Medicine Inpatient Follow Up Note  Reason for consult:  Goals of Care  HPI:  Per intake H&P --> [name] Mooreis a 87 y.o.femalewith PMH significant for paroxysmal atrial fibrillation on Xarelto,hx of stroke, PE in November 2020,frequent falls,hypertension, dementiawho lives at [facility] care.  Palliative care was asked to get involved to address symptoms and goals of care. Miss Stipp is known to the PMT service as we saw her in August.   Today's Discussion (10/16/2020):  *Please note that this is a verbal dictation therefore any spelling or grammatical errors are due to the "Dragon Medical One" system interpretation.  Chart reviewed. My colleague increased Chaunta's morphine gtt yesterday in the setting of unresolved  symptom burden (tachycardia and severe discomfort with reposition).  Upon assessment at bedside [name] appears far more comfortable today. She has a small grimace when moved and remains tachycardic therefore was provided a bolus while at bedside. Otherwise her oral cavity appears dry though well cleansed.   [facility] remains involved as this is their GIP patient - though we remain on board to aid in symptom management.   Daily family updates are being provided by the [facility] team.  Questions and concerns addressed   Objective Assessment: Vital Signs Vitals:   10/15/20 1819 10/15/20 2000  BP: (!) 133/55 (!) 120/57  Pulse: 85 85  Resp: 13 18  Temp: 98.2 F (36.8 C) 97.6 F (36.4 C)  SpO2: 96% 94%    Intake/Output Summary (Last 24 hours) at 10/16/2020 1149 Last data filed at 10/16/2020 1000 Gross per 24 hour  Intake 75.69 ml  Output 250 ml  Net -174.31 ml   Last Weight  Most recent update: 10/12/2020  5:27 AM   Weight  68.9 kg (151 lb 14.4 oz)           Gen:  Frail elderly F HEENT: dry mucous membranes - large R scalp hematoma with oozing blood CV: Regular rate and rhythm  PULM: RA, clear to auscultation bilaterally   ABD: soft/nontender  EXT: (+) trace LE edema  Neuro: Somnolent - not responsive   SUMMARY OF RECOMMENDATIONS DNAR/DNI  Comfort focused care   Morphine gtt at 2mg /hr with boluses PRN - additional comfort medications per [facility]  [facility] involved - patient is GIP  Spiritual Support requested  Time Spent: 25 Greater than 50% of the time was spent in counseling and coordination of care ______________________________________________________________________________________ [facility] [place] Palliative Medicine Team Team Cell Phone: [phone] Please utilize secure chat with additional questions, if there is no response within 30 minutes please call the above phone number  Palliative Medicine Team providers are available by phone from 7am to 7pm daily and can be reached through the team cell phone.  Should this patient require assistance outside of these hours, please call the patient's attending physician.

## 2020-10-16 NOTE — Progress Notes (Signed)
This chaplain responded to PMT consult for spiritual care.  The chaplain checked in with the Pt. RN-Maria and understands the Pt. is full comfort care. The Pt. is resting comfortably at the time of the visit.  The chaplain shared a quiet bedside presence and prayer with the Pt.  This chaplain is available for F/U spiritual care as needed.

## 2020-10-17 ENCOUNTER — Encounter (HOSPITAL_COMMUNITY): Payer: Self-pay | Admitting: Emergency Medicine

## 2020-10-17 DIAGNOSIS — F0280 Dementia in other diseases classified elsewhere without behavioral disturbance: Secondary | ICD-10-CM | POA: Diagnosis present

## 2020-10-17 DIAGNOSIS — Z515 Encounter for palliative care: Secondary | ICD-10-CM

## 2020-10-17 DIAGNOSIS — R791 Abnormal coagulation profile: Secondary | ICD-10-CM | POA: Diagnosis present

## 2020-10-17 DIAGNOSIS — E440 Moderate protein-calorie malnutrition: Secondary | ICD-10-CM | POA: Diagnosis present

## 2020-10-17 DIAGNOSIS — R627 Adult failure to thrive: Secondary | ICD-10-CM | POA: Diagnosis present

## 2020-10-17 DIAGNOSIS — I639 Cerebral infarction, unspecified: Secondary | ICD-10-CM | POA: Diagnosis present

## 2020-10-17 DIAGNOSIS — D649 Anemia, unspecified: Secondary | ICD-10-CM

## 2020-10-17 DIAGNOSIS — F419 Anxiety disorder, unspecified: Secondary | ICD-10-CM | POA: Diagnosis present

## 2020-10-17 DIAGNOSIS — N179 Acute kidney failure, unspecified: Secondary | ICD-10-CM | POA: Diagnosis present

## 2020-10-17 DIAGNOSIS — R001 Bradycardia, unspecified: Secondary | ICD-10-CM | POA: Diagnosis present

## 2020-10-17 DIAGNOSIS — I469 Cardiac arrest, cause unspecified: Secondary | ICD-10-CM | POA: Diagnosis present

## 2020-10-17 DIAGNOSIS — I82409 Acute embolism and thrombosis of unspecified deep veins of unspecified lower extremity: Secondary | ICD-10-CM | POA: Diagnosis present

## 2020-10-17 DIAGNOSIS — I10 Essential (primary) hypertension: Secondary | ICD-10-CM | POA: Diagnosis present

## 2020-10-17 DIAGNOSIS — I480 Paroxysmal atrial fibrillation: Secondary | ICD-10-CM | POA: Diagnosis present

## 2020-10-17 DIAGNOSIS — I2699 Other pulmonary embolism without acute cor pulmonale: Secondary | ICD-10-CM | POA: Diagnosis present

## 2020-10-17 DIAGNOSIS — D62 Acute posthemorrhagic anemia: Secondary | ICD-10-CM | POA: Diagnosis present

## 2020-10-24 NOTE — Discharge Summary (Signed)
Death Summary  Sylvia Davila WNU:[id number] DOB: [birth year] DOA: 11/04/20  PCP: Patient, No Pcp Per  Admit date: 11/04/2020 Date of Death: 2020/11/11 Time of Death: 0310 Notification: Patient, No Pcp Per notified of death of 11-Nov-2020   History of present illness:  [place] is a 87 y.o. female with a history significant for paroxysmal atrial fibrillation on Xarelto,hx of stroke, PE in November 2020,frequent falls,hypertension, advanced alzeimer's dementiawho presented from [facility] memory care.  The patient had a fall about a month ago prior to admission.  She presented to [facility] ED 2 nights prior to her presentation with bleeding from the scalp wound, at which time sutures were placed and the bleeding resolved.  She was discharged to SNF.  Per EMS report, the scalp hematoma "ruptured" again the night of her presentation and the patient was brought to [facility] ED. She had multiple ED visits for falls over the last few months and was admitted for syncope in August 2021.  Per chart review, patient arrived to [facility] ED as a level 1 trauma, post cardiac arrest found to be bleeding from her forehead wound at memory care facility.  Lost a large amount of blood.  She had brief CPR until it was established that patient is DNR/DNI.  Over the months, she declined physically and mentally.  She was under hospice care, [facility].   In the ED, patient was awake but had a low blood pressure of 82/40 with heart rate in the 100s. Labs showed a hemoglobin of 7.3K, creatinine elevated 1.48. INR 3.3 on Xarelto. Chest x-ray showed bibasilar opacities consistent with atelectasis. CT head did not show any acute intracranial process but showed soft tissue edema in the right frontal skull.  Was seen by trauma surgery Dr. Freida Busman. Previous sutures were removed and wound was sutured again.  She was subsequently admitted to the Hospitalist service.  Seen by PT OT and palliative care team.  Overall  no significant improvement with a poor prognosis.  Patient's daughter made decision for comfort care on 10/13/2020.   On 11/11/20 patient expired at [facility] at 0310.   Final Diagnoses:  1.  Cardiopulmonary arrest, comfort care DNR.    Acute on chronic anemia.   Hemorrhagic shock (HCC).   Elevated serum creatinine.   Scalp hematoma.   Atrial fibrillation (HCC).   Bleeding from wound.   Terminal care.   Palliative care encounter.   Failure to thrive in adult.   Moderate protein-calorie malnutrition (HCC).   Cardiac arrest (HCC), post.   Acute blood loss anemia.   Supratherapeutic INR.   Essential hypertension   AF (paroxysmal atrial fibrillation) (HCC).   Sinus bradycardia.   Stroke [facility]).   DVT (deep venous thrombosis) (HCC).   Pulmonary embolism (HCC).   AKI (acute kidney injury) (HCC).   Anxiety, chronic.   Alzheimer disease (HCC).   Comfort measures only status.   The results of significant diagnostics from this hospitalization (including imaging, microbiology, ancillary and laboratory) are listed below for reference.    Significant Diagnostic Studies: CT Head Wo Contrast  Result Date: 11/04/2020 CLINICAL DATA:  Fall on blood thinners EXAM: CT HEAD WITHOUT CONTRAST TECHNIQUE: Contiguous axial images were obtained from the base of the skull through the vertex without intravenous contrast. COMPARISON:  None. FINDINGS: Brain: No evidence of acute territorial infarction, hemorrhage, hydrocephalus,extra-axial collection or mass lesion/mass effect. There is dilatation the ventricles and sulci consistent with age-related atrophy. Low-attenuation changes in the deep white matter consistent  with small vessel ischemia. Vascular: No hyperdense vessel or unexpected calcification. Right-sided vertebral artery calcifications are seen. Skull: The skull is intact. No fracture or focal lesion identified. Sinuses/Orbits: The visualized paranasal sinuses and mastoid air cells are  clear. The orbits and globes intact. Other: A 2.3 cm hematoma seen overlying the right frontal skull. Cervical spine: Alignment: Straightening of the normal cervical lordosis is seen. Skull base and vertebrae: Visualized skull base is intact. No atlanto-occipital dissociation. The vertebral body heights are well maintained. No fracture or pathologic osseous lesion seen. There is diffuse osteopenia with heterogeneous appearance to the osseous structures in the mid cervical spine. Soft tissues and spinal canal: The visualized paraspinal soft tissues are unremarkable. No prevertebral soft tissue swelling is seen. The spinal canal is grossly unremarkable, no large epidural collection or significant canal narrowing. Disc levels: Multilevel cervical spine spondylosis seen with disc osteophyte complex and uncovertebral osteophytes most notable from C3 through C6 with severe neural foraminal narrowing and moderate central canal stenosis. Upper chest: Again noted is a ill-defined spiculated ground-glass opacities within the inferior medial left upper lobe air bronchograms. This dates back to prior exam of August 11, 2019. Thoracic inlet is within normal limits. Other: None IMPRESSION: No acute intracranial abnormality. Findings consistent with age related atrophy and chronic small vessel ischemia Soft tissue hematoma overlying the right frontal skull No acute fracture or malalignment of the spine. Electronically Signed   By: Jonna Clark M.D.   On: 10/18/2020 03:40   CT Cervical Spine Wo Contrast  Result Date: 10-18-2020 CLINICAL DATA:  Fall on blood thinners EXAM: CT HEAD WITHOUT CONTRAST TECHNIQUE: Contiguous axial images were obtained from the base of the skull through the vertex without intravenous contrast. COMPARISON:  None. FINDINGS: Brain: No evidence of acute territorial infarction, hemorrhage, hydrocephalus,extra-axial collection or mass lesion/mass effect. There is dilatation the ventricles and sulci  consistent with age-related atrophy. Low-attenuation changes in the deep white matter consistent with small vessel ischemia. Vascular: No hyperdense vessel or unexpected calcification. Right-sided vertebral artery calcifications are seen. Skull: The skull is intact. No fracture or focal lesion identified. Sinuses/Orbits: The visualized paranasal sinuses and mastoid air cells are clear. The orbits and globes intact. Other: A 2.3 cm hematoma seen overlying the right frontal skull. Cervical spine: Alignment: Straightening of the normal cervical lordosis is seen. Skull base and vertebrae: Visualized skull base is intact. No atlanto-occipital dissociation. The vertebral body heights are well maintained. No fracture or pathologic osseous lesion seen. There is diffuse osteopenia with heterogeneous appearance to the osseous structures in the mid cervical spine. Soft tissues and spinal canal: The visualized paraspinal soft tissues are unremarkable. No prevertebral soft tissue swelling is seen. The spinal canal is grossly unremarkable, no large epidural collection or significant canal narrowing. Disc levels: Multilevel cervical spine spondylosis seen with disc osteophyte complex and uncovertebral osteophytes most notable from C3 through C6 with severe neural foraminal narrowing and moderate central canal stenosis. Upper chest: Again noted is a ill-defined spiculated ground-glass opacities within the inferior medial left upper lobe air bronchograms. This dates back to prior exam of August 11, 2019. Thoracic inlet is within normal limits. Other: None IMPRESSION: No acute intracranial abnormality. Findings consistent with age related atrophy and chronic small vessel ischemia Soft tissue hematoma overlying the right frontal skull No acute fracture or malalignment of the spine. Electronically Signed   By: Jonna Clark M.D.   On: 10-18-20 03:40   DG Chest Portable 1 View  Result Date: 2020-10-18 CLINICAL  DATA:  Post chest  compressions, cardiac arrest EXAM: PORTABLE CHEST 1 VIEW COMPARISON:  Radiograph 07/06/2020, CT 05/19/2020 FINDINGS: Portion of the left costophrenic sulcus is collimated from view. No visible pneumothorax or effusion. Some streaky and hazy opacities in the lung bases are likely atelectatic on a background of more chronic interstitial and bronchitic change. No new consolidative opacity is seen. No convincing secondary features of frank pulmonary edema at this time. Cardiomediastinal contours are similar to priors accounting for differences in technique, with a calcified, tortuous aorta. No visible displaced rib fractures or other acute traumatic abnormality of the chest wall. Degenerative changes are present in the imaged spine and shoulders. Telemetry leads overlie the chest. IMPRESSION: 1. Some streaky and hazy opacities in the lung bases are likely atelectatic on a background of more chronic interstitial and bronchitic change. No new consolidative opacity or frank pulmonary edema. 2. No visible displaced rib fractures or other acute traumatic abnormality of the chest wall. 3.  Aortic Atherosclerosis (ICD10-I70.0). Electronically Signed   By: Kreg Shropshire M.D.   On: 10/23/2020 02:31    Microbiology: Recent Results (from the past 240 hour(s))  Resp Panel by RT-PCR (Flu A&B, Covid) Nasopharyngeal Swab     Status: None   Collection Time: 10/23/2020  2:15 AM   Specimen: Nasopharyngeal Swab; Nasopharyngeal(NP) swabs in vial transport medium  Result Value Ref Range Status   SARS Coronavirus 2 by RT PCR NEGATIVE NEGATIVE Final    Comment: (NOTE) SARS-CoV-2 target nucleic acids are NOT DETECTED.  The SARS-CoV-2 RNA is generally detectable in upper respiratory specimens during the acute phase of infection. The lowest concentration of SARS-CoV-2 viral copies this assay can detect is 138 copies/mL. A negative result does not preclude SARS-Cov-2 infection and should not be used as the sole basis for treatment  or other patient management decisions. A negative result may occur with  improper specimen collection/handling, submission of specimen other than nasopharyngeal swab, presence of viral mutation(s) within the areas targeted by this assay, and inadequate number of viral copies(<138 copies/mL). A negative result must be combined with clinical observations, patient history, and epidemiological information. The expected result is Negative.  Fact Sheet for Patients:  BloggerCourse.com  Fact Sheet for Healthcare Providers:  SeriousBroker.it  This test is no t yet approved or cleared by the [place] FDA and  has been authorized for detection and/or diagnosis of SARS-CoV-2 by FDA under an Emergency Use Authorization (EUA). This EUA will remain  in effect (meaning this test can be used) for the duration of the COVID-19 declaration under Section 564(b)(1) of the Act, 21 U.S.C.section 360bbb-3(b)(1), unless the authorization is terminated  or revoked sooner.       Influenza A by PCR NEGATIVE NEGATIVE Final   Influenza B by PCR NEGATIVE NEGATIVE Final    Comment: (NOTE) The Xpert Xpress SARS-CoV-2/FLU/RSV plus assay is intended as an aid in the diagnosis of influenza from Nasopharyngeal swab specimens and should not be used as a sole basis for treatment. Nasal washings and aspirates are unacceptable for Xpert Xpress SARS-CoV-2/FLU/RSV testing.  Fact Sheet for Patients: BloggerCourse.com  Fact Sheet for Healthcare Providers: SeriousBroker.it  This test is not yet approved or cleared by the [place] FDA and has been authorized for detection and/or diagnosis of SARS-CoV-2 by FDA under an Emergency Use Authorization (EUA). This EUA will remain in effect (meaning this test can be used) for the duration of the COVID-19 declaration under Section 564(b)(1) of the Act, 21 U.S.C. section  360bbb-3(b)(1), unless the authorization is terminated or revoked.  Performed at [facility], [address], [place], [place] [address]      Labs: Basic Metabolic Panel: Recent Labs  Lab 10/11/20 0537 10/12/20 0705 10/13/20 0312  NA 144 147* 145  K 3.6 4.0 4.1  CL 113* 114* 112*  CO2 23 26 26   GLUCOSE 99 110* 99  BUN 16 16 12   CREATININE 0.84 0.99 0.92  CALCIUM 8.3* 8.6* 8.4*   Liver Function Tests: No results for input(s): AST, ALT, ALKPHOS, BILITOT, PROT, ALBUMIN in the last 168 hours. No results for input(s): LIPASE, AMYLASE in the last 168 hours. No results for input(s): AMMONIA in the last 168 hours. CBC: Recent Labs  Lab 10/11/20 0537 10/12/20 0705 10/13/20 0312  WBC 8.7 7.8 6.8  NEUTROABS 6.0 4.9 4.1  HGB 8.8* 8.5* 8.7*  HCT 28.1* 27.7* 28.8*  MCV 92.1 94.2 95.4  PLT 220 221 212   Cardiac Enzymes: No results for input(s): CKTOTAL, CKMB, CKMBINDEX, TROPONINI in the last 168 hours. D-Dimer No results for input(s): DDIMER in the last 72 hours. BNP: Invalid input(s): POCBNP CBG: No results for input(s): GLUCAP in the last 168 hours. Anemia work up No results for input(s): VITAMINB12, FOLATE, FERRITIN, TIBC, IRON, RETICCTPCT in the last 72 hours. Urinalysis    Component Value Date/Time   COLORURINE AMBER (A) 07/06/2020 0844   APPEARANCEUR CLOUDY (A) 07/06/2020 0844   LABSPEC 1.016 07/06/2020 0844   PHURINE 5.0 07/06/2020 0844   GLUCOSEU NEGATIVE 07/06/2020 0844   HGBUR SMALL (A) 07/06/2020 0844   BILIRUBINUR NEGATIVE 07/06/2020 0844   KETONESUR NEGATIVE 07/06/2020 0844   PROTEINUR 100 (A) 07/06/2020 0844   UROBILINOGEN 0.2 04/29/2009 1144   NITRITE POSITIVE (A) 07/06/2020 0844   LEUKOCYTESUR LARGE (A) 07/06/2020 0844   Sepsis Labs Invalid input(s): PROCALCITONIN,  WBC,  LACTICIDVEN     SIGNED:  Darlin Drop, MD  [facility] 2020-10-30, 5:59 PM Pager   If 7PM-7AM, please contact night-coverage [email or web address] Password  [email or web address]

## 2020-10-24 NOTE — Progress Notes (Signed)
Morphine, 39ml, Iv drip wasted to Steri solution container with Mindy, RN.

## 2020-10-24 NOTE — Progress Notes (Signed)
Patient expired at 0310: RN Anette Guarneri and RN Mindy assessed for apical pulse and breathing for 2 minutes total.    MD Zierle-Ghosh notified.  CDS reference # [id number] / Jackolyn Confer  Pt is not a donor candidate

## 2020-10-24 NOTE — Progress Notes (Signed)
Patients daughter Reggy Eye called, no response, and voicemail left @ 350am to call RN back.

## 2020-10-24 DEATH — deceased

## 2021-04-27 IMAGING — CT CT CERVICAL SPINE W/O CM
3 of 4 series · 12 of 33 positions shown, 14 images · non-contrast
Comparison: 06/03/2018

CLINICAL DATA: Loss of consciousness

EXAM:
CT HEAD WITHOUT CONTRAST
CT CERVICAL SPINE WITHOUT CONTRAST
TECHNIQUE: Multidetector CT imaging of the head and cervical spine was
performed following the standard protocol without intravenous
contrast. Multiplanar CT image reconstructions of the cervical spine
were also generated.

[Series 5: orthogonal bone · axial · 0.23mm/px · z∈[-263,-141]mm · 4 of 98 slices shown, 5 images]
[im 17/98  soft-tissue]
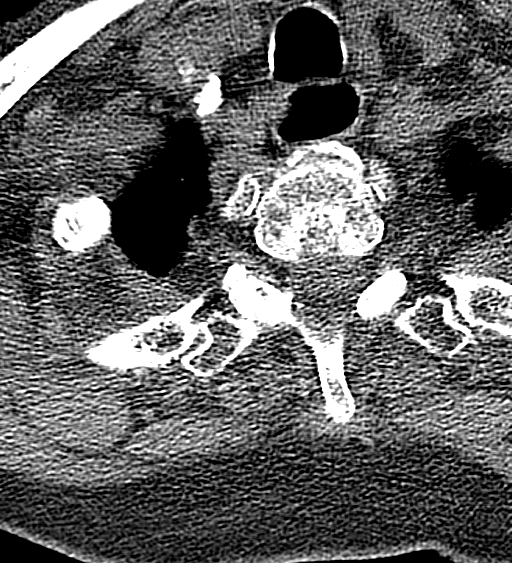
[im 17/98  bone]
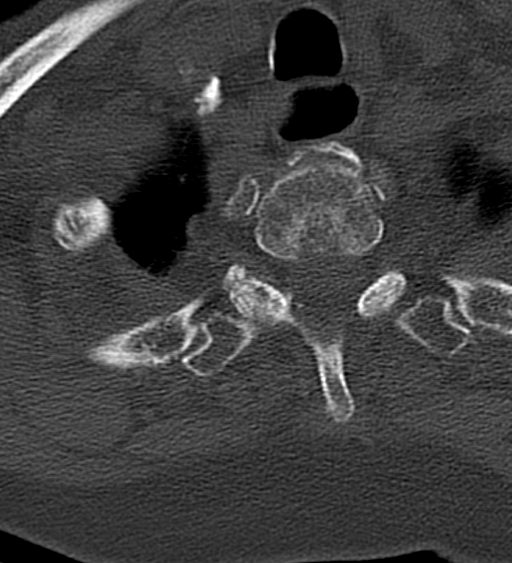
[im 33/98  bone]
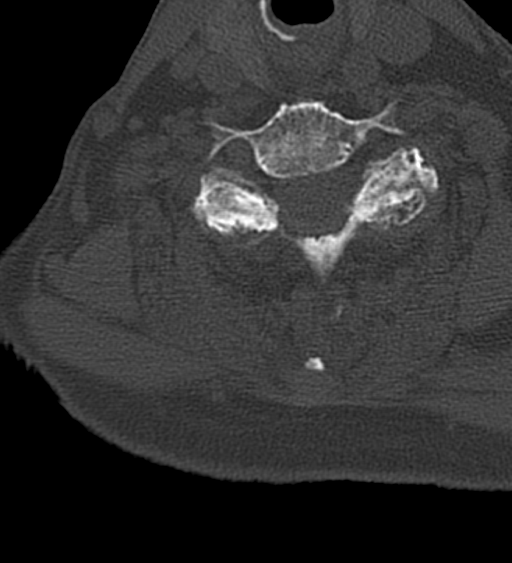
[im 65/98  bone]
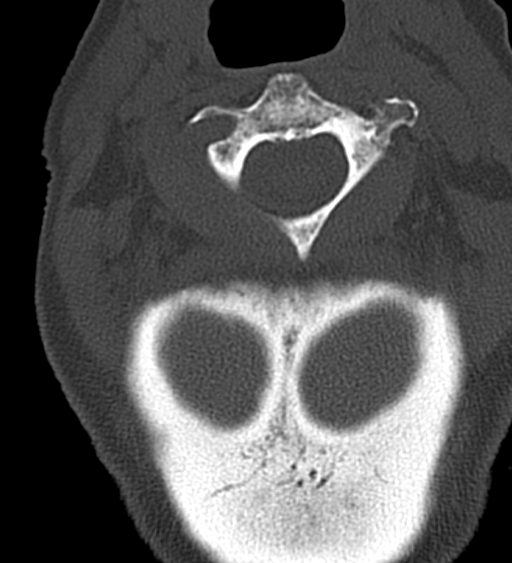
[im 81/98  bone]
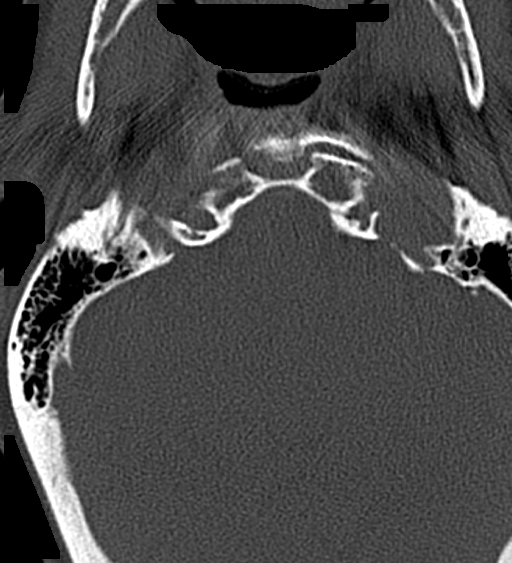

[Series 6: coronal bone · coronal · 0.26mm/px · 3 of 61 slices shown]
[im 13/61  bone]
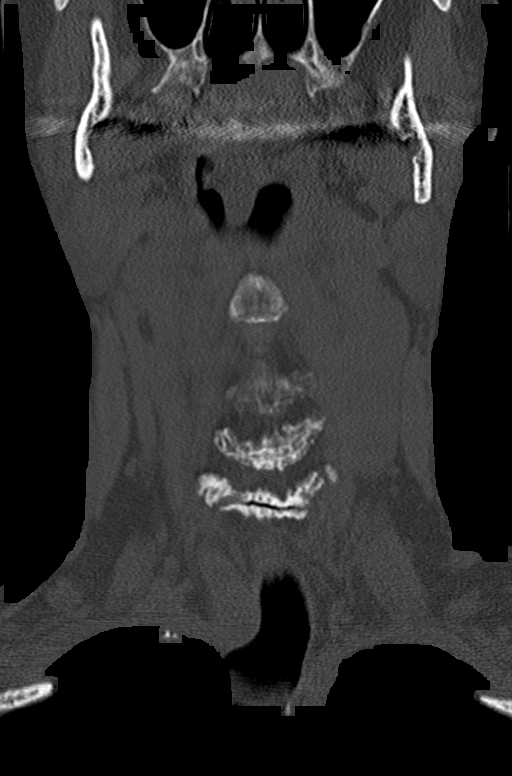
[im 25/61  bone]
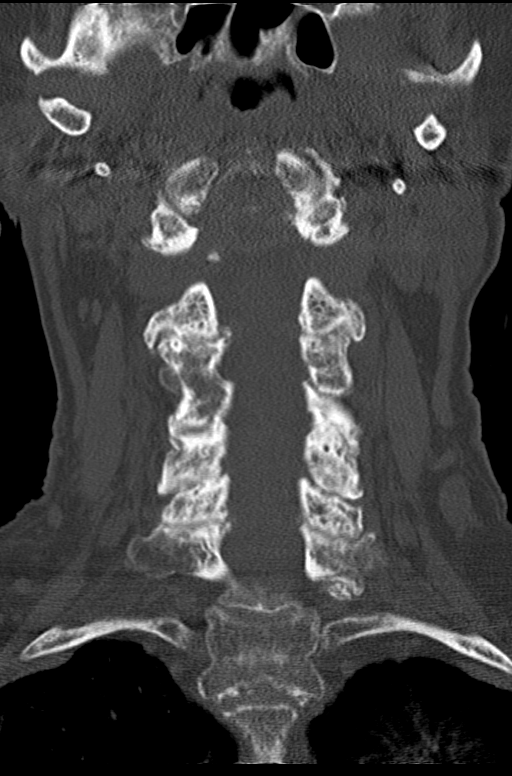
[im 37/61  bone]
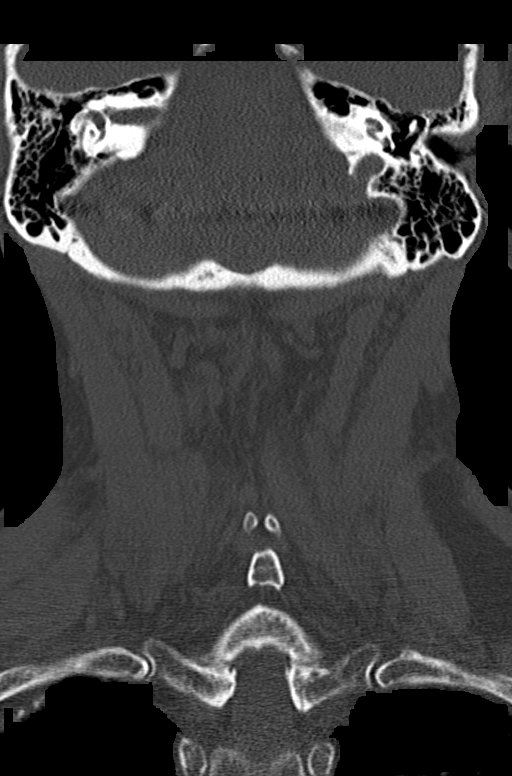

[Series 7: sagittal bone · sagittal · 0.28mm/px · 5 of 61 slices shown, 6 images]
[im 21/61  bone]
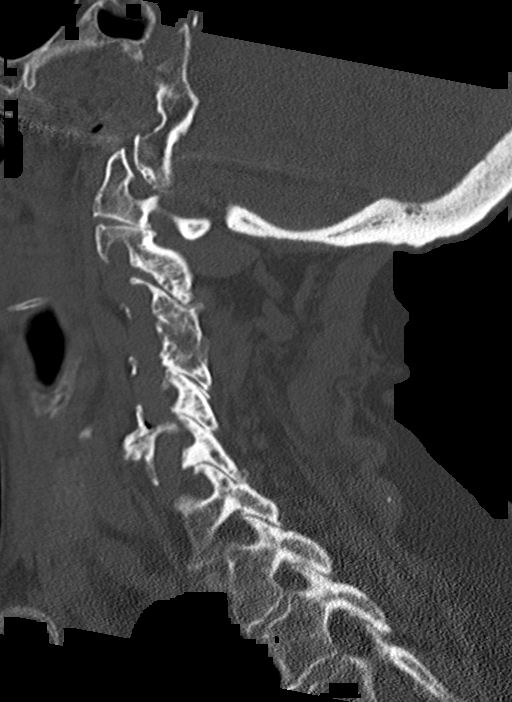
[im 26/61  bone]
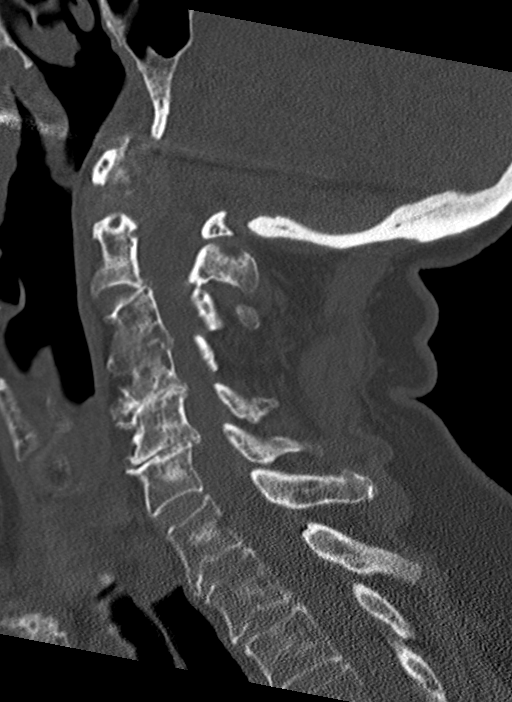
[im 31/61  soft-tissue]
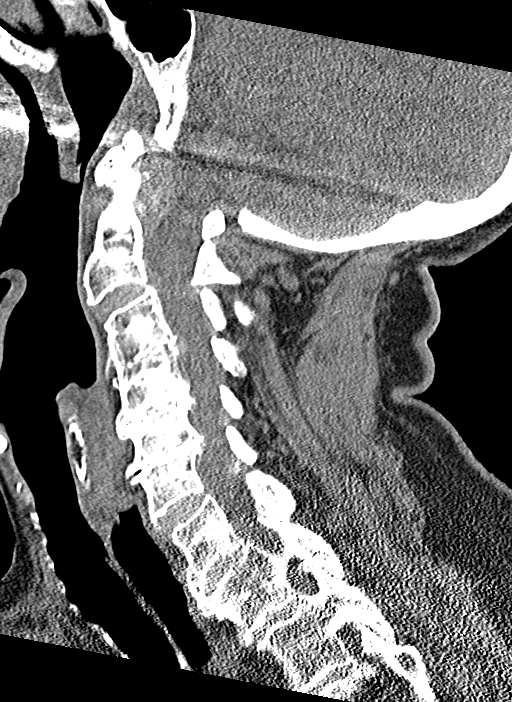
[im 31/61  bone]
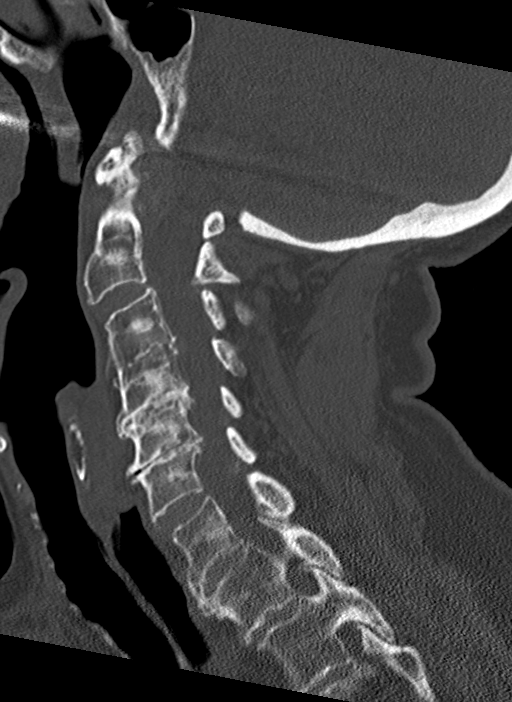
[im 36/61  bone]
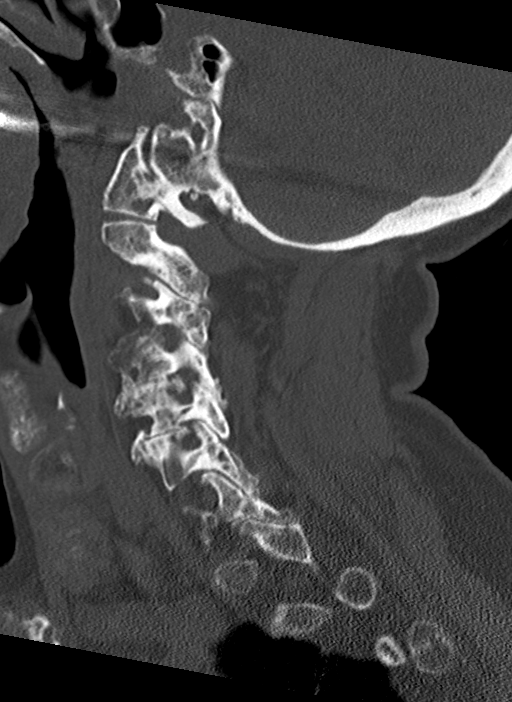
[im 41/61  bone]
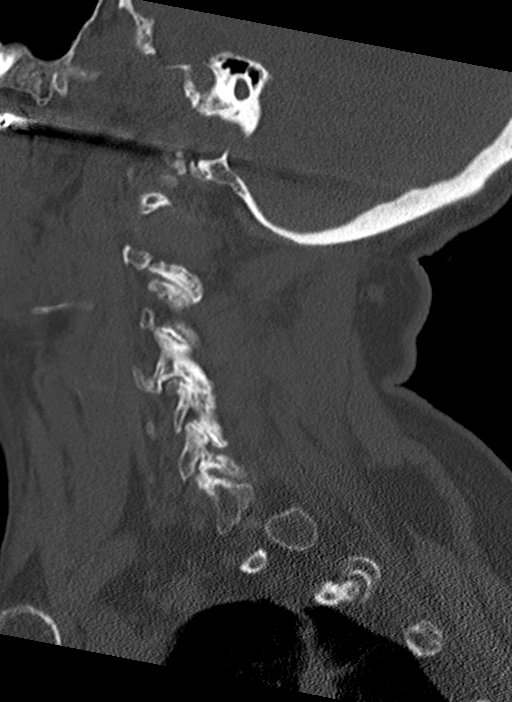

[12 of 33 positions shown; findings below may reference images not displayed]

FINDINGS: CT HEAD FINDINGS

Brain: No evidence of acute infarction, hemorrhage, hydrocephalus,
extra-axial collection or mass lesion/mass effect. Periventricular
white matter hypodensity.

Vascular: No hyperdense vessel or unexpected calcification.

Skull: Normal. Negative for fracture or focal lesion.

Sinuses/Orbits: No acute finding.

Other: None.

CT CERVICAL SPINE FINDINGS

Alignment: Degenerative straightening of the normal cervical
lordosis.

Skull base and vertebrae: No acute fracture. No primary bone lesion
or focal pathologic process.

Soft tissues and spinal canal: No prevertebral fluid or swelling. No
visible canal hematoma.

Disc levels: Severe multilevel disc space height loss and
osteophytosis.

Upper chest: There is a partially imaged mixed solid and
ground-glass opacity of the left pulmonary apex, in keeping with
findings of prior chest CT dated 08/11/2019 (series 5, image 95).

Other: None.
IMPRESSION: 1. No acute intracranial pathology. Small-vessel white matter
disease.
2. No acute fracture or static subluxation of the cervical spine.
3. Severe multilevel degenerative disc disease of the cervical
spine.
4. Partially imaged mixed solid and ground-glass opacity of the left
pulmonary apex, in keeping with findings of prior chest CT dated
08/11/2019.

## 2021-04-27 IMAGING — CT CT HEAD W/O CM
3 series · 15 of 47 positions shown, 18 images · non-contrast
Comparison: 06/03/2018

CLINICAL DATA: Loss of consciousness

EXAM:
CT HEAD WITHOUT CONTRAST
CT CERVICAL SPINE WITHOUT CONTRAST
TECHNIQUE: Multidetector CT imaging of the head and cervical spine was
performed following the standard protocol without intravenous
contrast. Multiplanar CT image reconstructions of the cervical spine
were also generated.

[Series 2: head wo · axial · 0.47mm/px · z∈[-121,+14]mm · 9 of 33 slices shown, 12 images]
[im 3/33  brain]
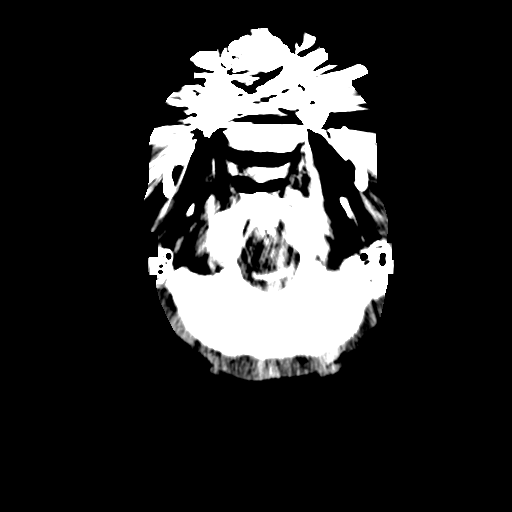
[im 3/33  bone]
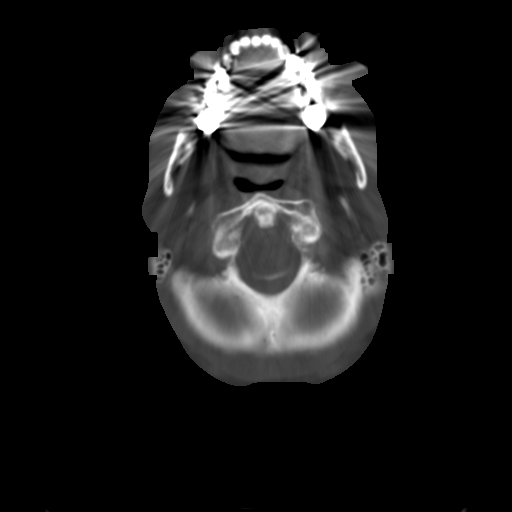
[im 6/33  brain]
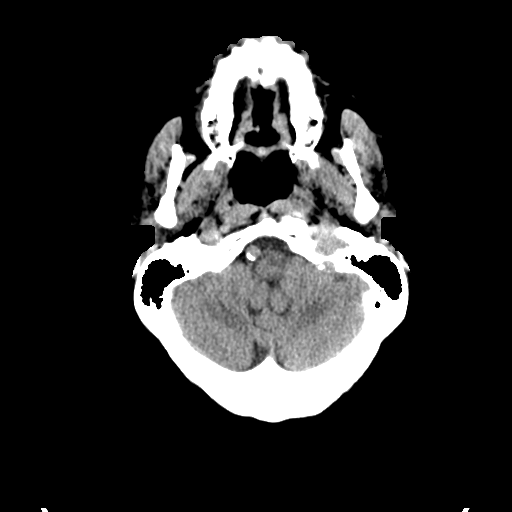
[im 9/33  brain]
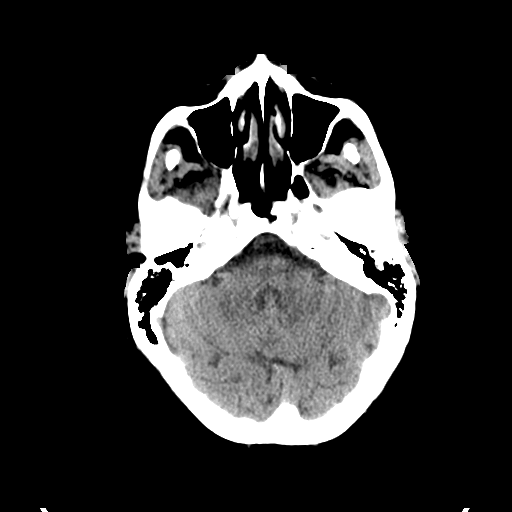
[im 13/33  brain]
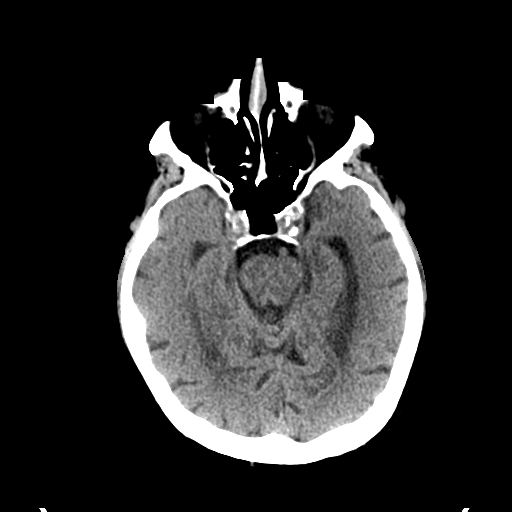
[im 17/33  brain]
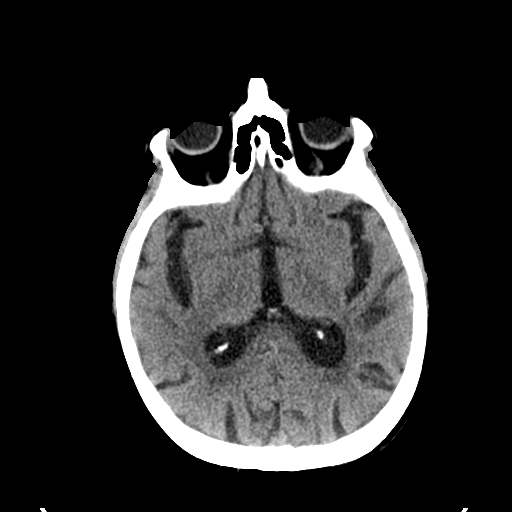
[im 17/33  bone]
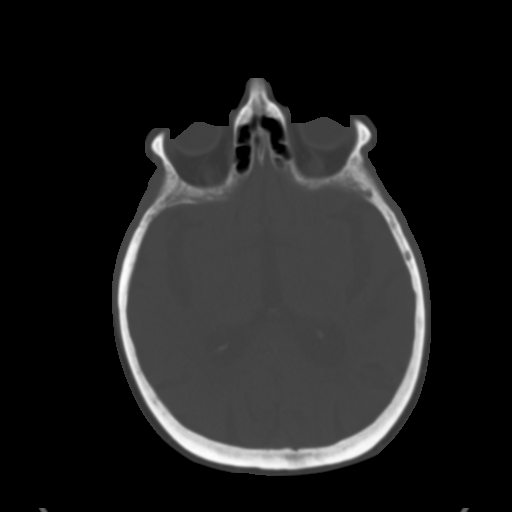
[im 20/33  brain]
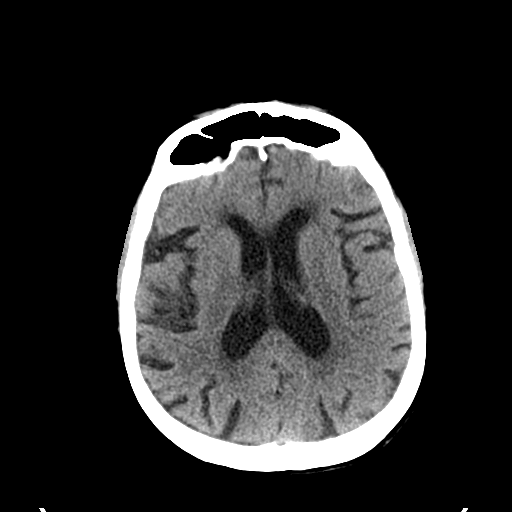
[im 24/33  brain]
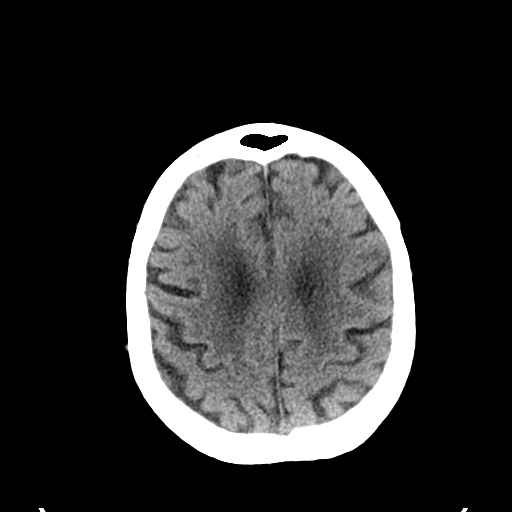
[im 27/33  brain]
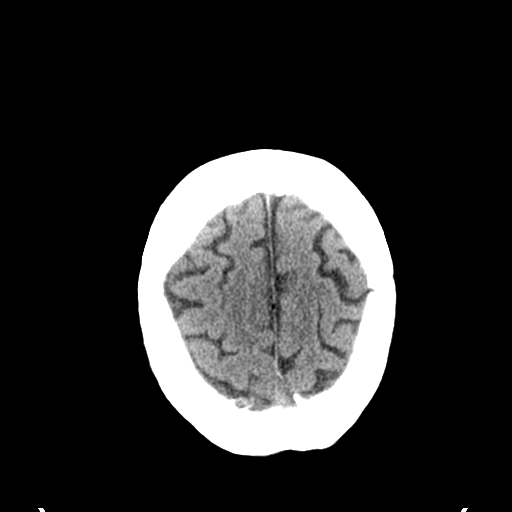
[im 30/33  brain]
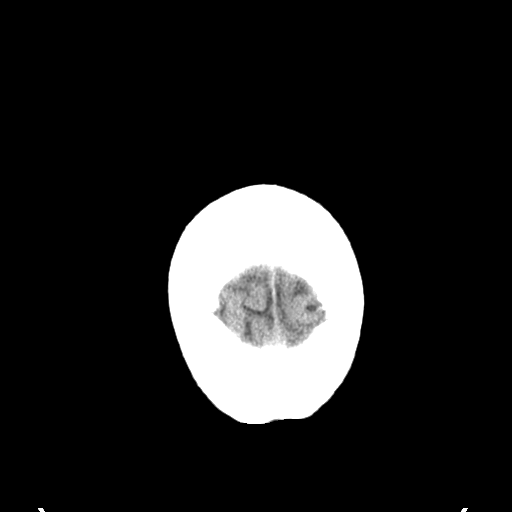
[im 30/33  bone]
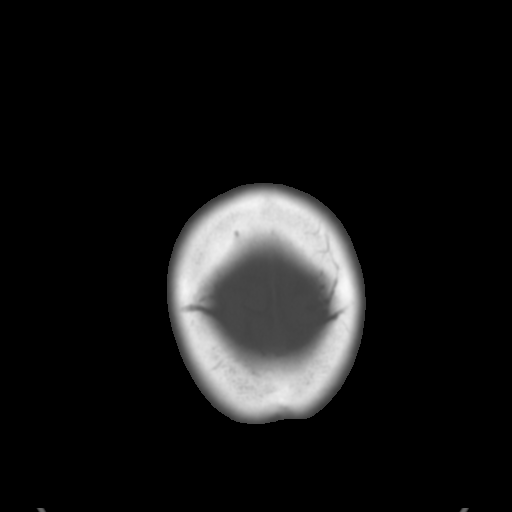

[Series 5: coronal soft tissue · coronal · 0.32mm/px · 3 of 84 slices shown]
[im 28/84  brain]
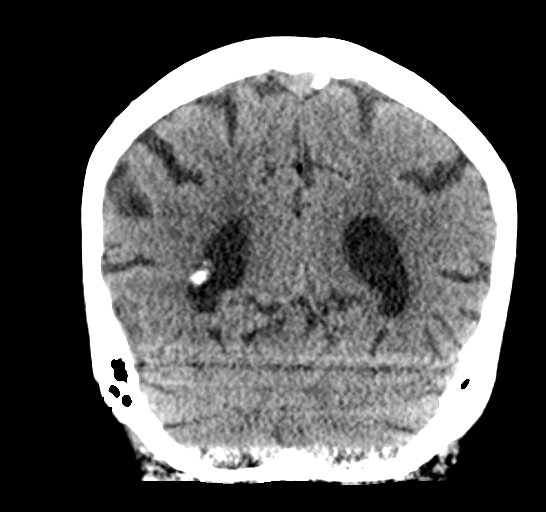
[im 37/84  brain]
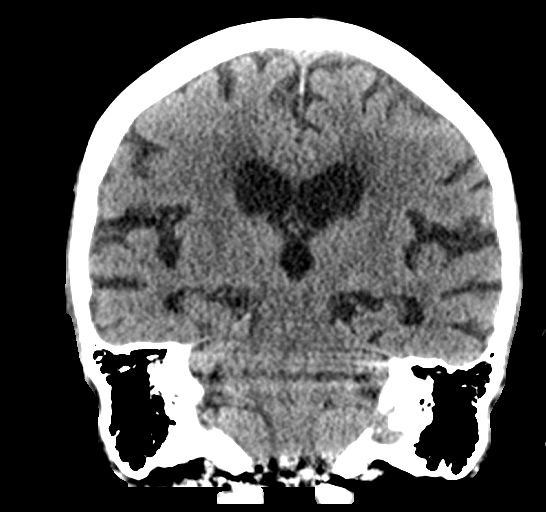
[im 47/84  brain]
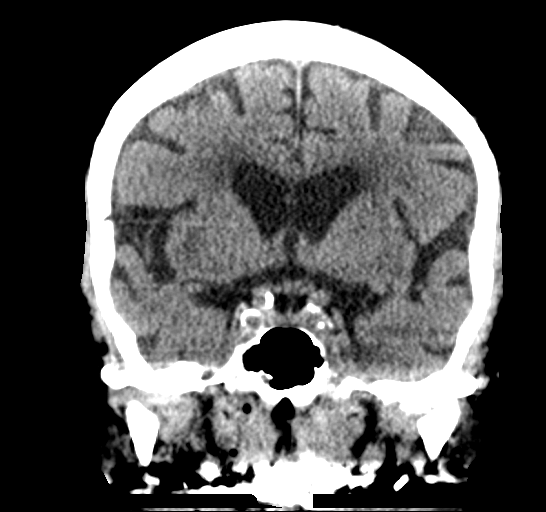

[Series 6: sagittal soft tissue · sagittal · 0.31mm/px · 3 of 61 slices shown]
[im 21/61  brain]
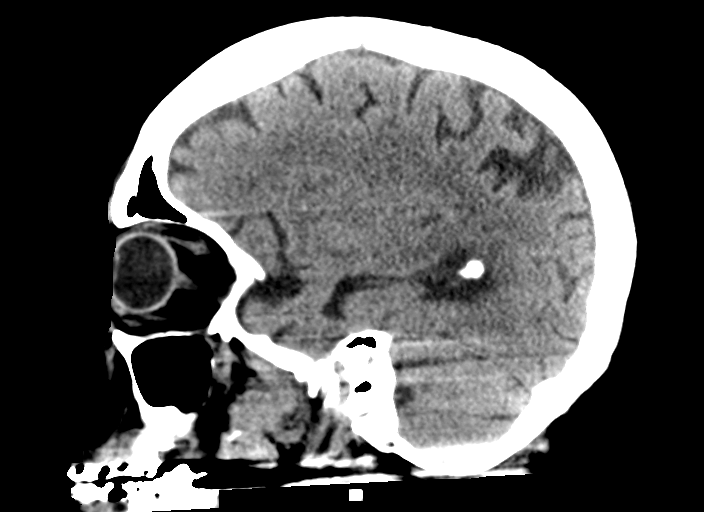
[im 31/61  brain]
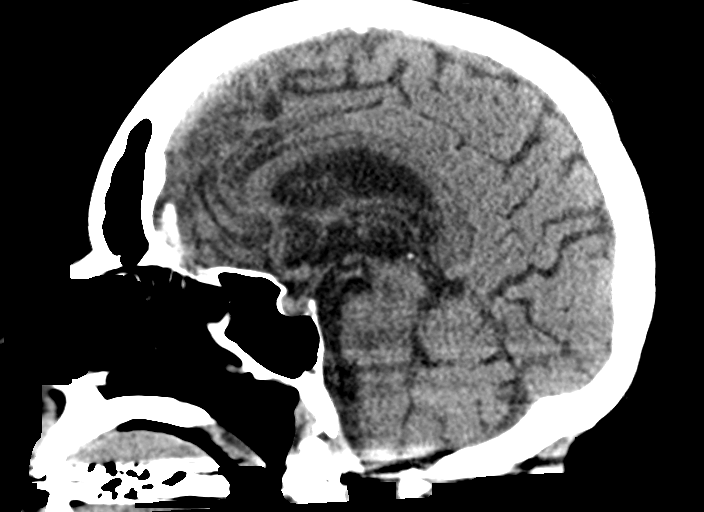
[im 41/61  brain]
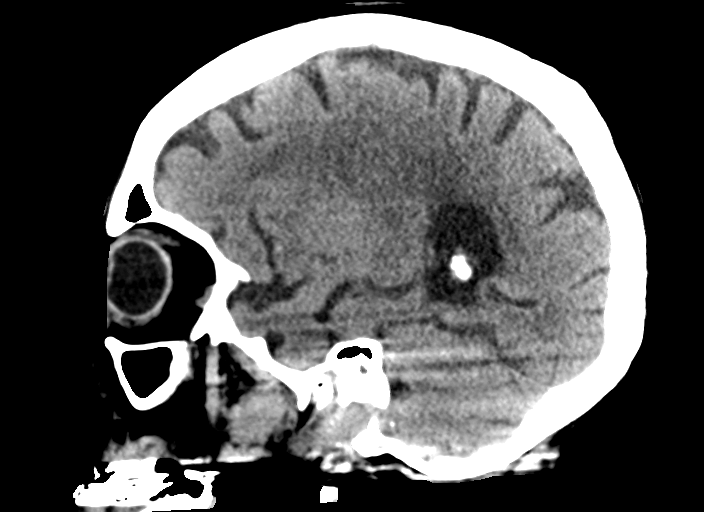

[15 of 47 positions shown; findings below may reference images not displayed]

FINDINGS: CT HEAD FINDINGS

Brain: No evidence of acute infarction, hemorrhage, hydrocephalus,
extra-axial collection or mass lesion/mass effect. Periventricular
white matter hypodensity.

Vascular: No hyperdense vessel or unexpected calcification.

Skull: Normal. Negative for fracture or focal lesion.

Sinuses/Orbits: No acute finding.

Other: None.

CT CERVICAL SPINE FINDINGS

Alignment: Degenerative straightening of the normal cervical
lordosis.

Skull base and vertebrae: No acute fracture. No primary bone lesion
or focal pathologic process.

Soft tissues and spinal canal: No prevertebral fluid or swelling. No
visible canal hematoma.

Disc levels: Severe multilevel disc space height loss and
osteophytosis.

Upper chest: There is a partially imaged mixed solid and
ground-glass opacity of the left pulmonary apex, in keeping with
findings of prior chest CT dated 08/11/2019 (series 5, image 95).

Other: None.
IMPRESSION: 1. No acute intracranial pathology. Small-vessel white matter
disease.
2. No acute fracture or static subluxation of the cervical spine.
3. Severe multilevel degenerative disc disease of the cervical
spine.
4. Partially imaged mixed solid and ground-glass opacity of the left
pulmonary apex, in keeping with findings of prior chest CT dated
08/11/2019.
